# Patient Record
Sex: Female | Born: 1965 | Race: White | Hispanic: No | Marital: Single | Smoking: Current every day smoker
Health system: Southern US, Community
[De-identification: ages and names within clinical notes are randomized; demographics above are authoritative.]

## PROBLEM LIST (undated history)

## (undated) DIAGNOSIS — E7800 Pure hypercholesterolemia, unspecified: Secondary | ICD-10-CM

## (undated) DIAGNOSIS — J189 Pneumonia, unspecified organism: Secondary | ICD-10-CM

## (undated) DIAGNOSIS — I10 Essential (primary) hypertension: Secondary | ICD-10-CM

## (undated) DIAGNOSIS — R7303 Prediabetes: Secondary | ICD-10-CM

## (undated) DIAGNOSIS — K219 Gastro-esophageal reflux disease without esophagitis: Secondary | ICD-10-CM

## (undated) HISTORY — PX: TUBAL LIGATION: SHX77

## (undated) HISTORY — PX: CHOLECYSTECTOMY: SHX55

---

## 1997-10-14 ENCOUNTER — Encounter: Admission: RE | Admit: 1997-10-14 | Discharge: 1997-10-14 | Payer: Self-pay | Admitting: Family Medicine

## 1997-10-27 ENCOUNTER — Encounter: Admission: RE | Admit: 1997-10-27 | Discharge: 1997-10-27 | Payer: Self-pay | Admitting: Family Medicine

## 1999-09-15 ENCOUNTER — Encounter: Admission: RE | Admit: 1999-09-15 | Discharge: 1999-09-15 | Payer: Self-pay | Admitting: Family Medicine

## 2001-03-14 ENCOUNTER — Encounter: Admission: RE | Admit: 2001-03-14 | Discharge: 2001-03-14 | Payer: Self-pay | Admitting: Family Medicine

## 2005-01-10 ENCOUNTER — Ambulatory Visit: Payer: Self-pay | Admitting: Family Medicine

## 2005-05-30 ENCOUNTER — Ambulatory Visit: Payer: Self-pay | Admitting: Family Medicine

## 2006-05-04 DIAGNOSIS — R12 Heartburn: Secondary | ICD-10-CM

## 2018-12-20 ENCOUNTER — Other Ambulatory Visit: Payer: Self-pay

## 2018-12-20 DIAGNOSIS — Z20822 Contact with and (suspected) exposure to covid-19: Secondary | ICD-10-CM

## 2018-12-21 LAB — NOVEL CORONAVIRUS, NAA: SARS-CoV-2, NAA: NOT DETECTED

## 2018-12-24 ENCOUNTER — Telehealth: Payer: Self-pay | Admitting: General Practice

## 2018-12-24 NOTE — Telephone Encounter (Signed)
Gave patient negative covid results. Patient understood.  °

## 2019-04-21 ENCOUNTER — Ambulatory Visit
Admission: EM | Admit: 2019-04-21 | Discharge: 2019-04-21 | Disposition: A | Discharge status: Home / Self Care | Payer: Self-pay | Attending: Emergency Medicine | Admitting: Emergency Medicine

## 2019-04-21 ENCOUNTER — Other Ambulatory Visit: Payer: Self-pay

## 2019-04-21 DIAGNOSIS — J209 Acute bronchitis, unspecified: Secondary | ICD-10-CM

## 2019-04-21 DIAGNOSIS — Z20822 Contact with and (suspected) exposure to covid-19: Secondary | ICD-10-CM

## 2019-04-21 HISTORY — DX: Gastro-esophageal reflux disease without esophagitis: K21.9

## 2019-04-21 MED ORDER — PREDNISONE 10 MG (21) PO TBPK: ORAL_TABLET | ORAL | 0 refills | Status: DC

## 2019-04-21 MED ORDER — FLUTICASONE PROPIONATE 50 MCG/ACT NA SUSP: 1.0000 | Freq: Every day | NASAL | 0 refills | Status: DC

## 2019-04-21 MED ORDER — BENZONATATE 100 MG PO CAPS: 100.0000 mg | ORAL_CAPSULE | Freq: Three times a day (TID) | ORAL | 0 refills | Status: DC

## 2019-04-21 MED ORDER — AZITHROMYCIN 250 MG PO TABS: 250.0000 mg | ORAL_TABLET | Freq: Every day | ORAL | 0 refills | Status: DC

## 2019-04-21 NOTE — ED Triage Notes (Signed)
Pt presents to UC w/ c/o chest and nasal congestion, productive cough, lowgrade fever (highest 100.3) x3 days
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
## 2019-04-21 NOTE — Discharge Instructions (Addendum)
COVID testing ordered.  It will take between 5-7 days for test results.  Someone will contact you regarding abnormal results.    In the meantime: You should remain isolated in your home for 10 days from symptom onset AND greater than 72 hours after symptoms resolution (absence of fever without the use of fever-reducing medication and improvement in respiratory symptoms), whichever is longer Get plenty of rest and push fluids Azithromycin was prescribed Tessalon Perles prescribed for cough Prednisone prescribed Flonase prescribed for nasal congestion and runny nose Use medications daily for symptom relief Use OTC medications like ibuprofen or tylenol as needed fever or pain Call or go to the ED if you have any new or worsening symptoms such as fever, worsening cough, shortness of breath, chest tightness, chest pain, turning blue, changes in mental status, etc..Marland Kitchen

## 2019-04-22 LAB — NOVEL CORONAVIRUS, NAA: SARS-CoV-2, NAA: NOT DETECTED

## 2020-10-21 ENCOUNTER — Other Ambulatory Visit: Payer: Self-pay

## 2020-10-21 ENCOUNTER — Emergency Department (HOSPITAL_COMMUNITY)
Admission: EM | Admit: 2020-10-21 | Discharge: 2020-10-21 | Disposition: A | Discharge status: Home / Self Care | Payer: Self-pay | Attending: Emergency Medicine | Admitting: Emergency Medicine

## 2020-10-21 ENCOUNTER — Encounter (HOSPITAL_COMMUNITY): Payer: Self-pay

## 2020-10-21 DIAGNOSIS — M5432 Sciatica, left side: Secondary | ICD-10-CM

## 2020-10-21 DIAGNOSIS — R202 Paresthesia of skin: Secondary | ICD-10-CM | POA: Insufficient documentation

## 2020-10-21 DIAGNOSIS — F17210 Nicotine dependence, cigarettes, uncomplicated: Secondary | ICD-10-CM | POA: Insufficient documentation

## 2020-10-21 DIAGNOSIS — M545 Low back pain, unspecified: Secondary | ICD-10-CM | POA: Insufficient documentation

## 2020-10-21 MED ORDER — PREDNISONE 10 MG PO TABS: 40.0000 mg | ORAL_TABLET | Freq: Every day | ORAL | 0 refills | Status: DC

## 2020-10-21 MED ORDER — DEXAMETHASONE SODIUM PHOSPHATE 10 MG/ML IJ SOLN
12.0000 mg | Freq: Once | INTRAMUSCULAR | Status: AC
  Administered 2020-10-21: 12 mg via INTRAMUSCULAR
  Filled 2020-10-21: qty 2
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
## 2020-10-21 NOTE — ED Triage Notes (Signed)
Pt has back pain for the last couple of months, reports pain from back into leg and buttock, left side.  Pt alert and oriented, skin warm and dry

## 2020-10-21 NOTE — Discharge Instructions (Addendum)
Starting tomorrow, take 20 mg of prednisone in the morning and 20 mg of prednisone at night for 5 days.  Should help alleviate the pain, sciatica.  I have also given you information for a primary care provider, I would recommend establishing care with somebody and to recheck your blood pressure.  I have also given you information to neurosurgery, if symptoms do not improve or if they worsen you can follow-up with them for additional work-up.  If you develop a fever, urinary retention, unilateral weakness please return back to the ED for additional evaluation.

## 2020-11-05 ENCOUNTER — Emergency Department (HOSPITAL_COMMUNITY)
Admission: EM | Admit: 2020-11-05 | Discharge: 2020-11-05 | Disposition: A | Discharge status: Home / Self Care | Payer: Self-pay | Attending: Emergency Medicine | Admitting: Emergency Medicine

## 2020-11-05 ENCOUNTER — Encounter (HOSPITAL_COMMUNITY): Payer: Self-pay

## 2020-11-05 ENCOUNTER — Other Ambulatory Visit: Payer: Self-pay

## 2020-11-05 ENCOUNTER — Emergency Department (HOSPITAL_COMMUNITY): Payer: Self-pay

## 2020-11-05 DIAGNOSIS — F17210 Nicotine dependence, cigarettes, uncomplicated: Secondary | ICD-10-CM | POA: Insufficient documentation

## 2020-11-05 DIAGNOSIS — K5900 Constipation, unspecified: Secondary | ICD-10-CM | POA: Insufficient documentation

## 2020-11-05 DIAGNOSIS — R202 Paresthesia of skin: Secondary | ICD-10-CM | POA: Insufficient documentation

## 2020-11-05 DIAGNOSIS — M5442 Lumbago with sciatica, left side: Secondary | ICD-10-CM | POA: Insufficient documentation

## 2020-11-05 MED ORDER — HYDROCODONE-ACETAMINOPHEN 5-325 MG PO TABS: ORAL_TABLET | ORAL | 0 refills | Status: DC

## 2020-11-05 MED ORDER — KETOROLAC TROMETHAMINE 30 MG/ML IJ SOLN
30.0000 mg | Freq: Once | INTRAMUSCULAR | Status: AC
  Administered 2020-11-05: 30 mg via INTRAVENOUS
  Filled 2020-11-05: qty 1

## 2020-11-05 MED ORDER — ONDANSETRON HCL 4 MG PO TABS: 4.0000 mg | ORAL_TABLET | Freq: Four times a day (QID) | ORAL | 0 refills | Status: DC

## 2020-11-05 NOTE — Discharge Instructions (Addendum)
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 I think this needs to be managed by the neurosurgery/spine team, which I have included contact information for.  I am writing you a short course of pain medicine to tide you over until you can be seen by the spine specialists, as well as a prescription for Zofran, which is a nausea medicine.
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
## 2020-11-05 NOTE — ED Triage Notes (Signed)
Pt presents to ED with complaints of continued lower back pain down left leg. Pt states pain continues to get worse without relief.

## 2020-12-02 ENCOUNTER — Ambulatory Visit: Payer: Self-pay | Admitting: Family Medicine

## 2020-12-02 ENCOUNTER — Other Ambulatory Visit: Payer: Self-pay

## 2020-12-03 ENCOUNTER — Ambulatory Visit: Payer: Self-pay | Admitting: Family Medicine

## 2020-12-03 NOTE — Congregational Nurse Program (Signed)
Client into [facility] today to enroll in [facility]. Client with recent back pain and 2 recent ER Visits and MRI. States she has had a neurosurgeon visit  ( Dr Conchita Paris at [facility]) and will need surgery possibly.  Client wishes to establish primary Medical care with [facility]. Appointment secured for 12/17/20 at 1030 am  Client referred back to Alvis Lemmings CHW to begin [place] financial assistance process. She has had 2 ER visits recently.  Plan: Follow up with client after first visit.  Francee Nodal RN [employer]

## 2020-12-17 ENCOUNTER — Ambulatory Visit: Payer: Self-pay | Admitting: Physician Assistant

## 2020-12-17 ENCOUNTER — Encounter: Payer: Self-pay | Admitting: Physician Assistant

## 2020-12-17 VITALS — BP 164/87 | HR 106 | Temp 98.7°F | Ht 68.0 in | Wt 211.5 lb

## 2020-12-17 DIAGNOSIS — I10 Essential (primary) hypertension: Secondary | ICD-10-CM

## 2020-12-17 DIAGNOSIS — Z13220 Encounter for screening for lipoid disorders: Secondary | ICD-10-CM

## 2020-12-17 DIAGNOSIS — K219 Gastro-esophageal reflux disease without esophagitis: Secondary | ICD-10-CM

## 2020-12-17 DIAGNOSIS — Z1211 Encounter for screening for malignant neoplasm of colon: Secondary | ICD-10-CM

## 2020-12-17 DIAGNOSIS — Z7689 Persons encountering health services in other specified circumstances: Secondary | ICD-10-CM

## 2020-12-17 DIAGNOSIS — M48061 Spinal stenosis, lumbar region without neurogenic claudication: Secondary | ICD-10-CM

## 2020-12-17 DIAGNOSIS — Z131 Encounter for screening for diabetes mellitus: Secondary | ICD-10-CM

## 2020-12-17 DIAGNOSIS — Z1239 Encounter for other screening for malignant neoplasm of breast: Secondary | ICD-10-CM

## 2020-12-17 DIAGNOSIS — F17200 Nicotine dependence, unspecified, uncomplicated: Secondary | ICD-10-CM

## 2020-12-17 DIAGNOSIS — M5416 Radiculopathy, lumbar region: Secondary | ICD-10-CM

## 2020-12-17 MED ORDER — AMLODIPINE BESYLATE 5 MG PO TABS: 5.0000 mg | ORAL_TABLET | Freq: Every day | ORAL | 1 refills | Status: DC

## 2020-12-17 NOTE — Progress Notes (Signed)
BP (!) 164/87   Pulse (!) 106   Temp 98.7 F (37.1 C)   Ht 5\' 8"  (1.727 m)   Wt 211 lb 8 oz (95.9 kg)   SpO2 99%   BMI 32.16 kg/m    Subjective:    Patient ID: , female    DOB: Sep 04, 1965, 55 y.o.   MRN: [id number]  HPI: Susan Velasquez is a 55 y.o. female presenting on 12/17/2020 for New Patient (Initial Visit)   HPI   Pt had a negative covid 19 screening questionnaire.  Chief Complaint  Patient presents with   New Patient (Initial Visit)   Pt is 55yoF who says she has not had PCP in many years and she is ready to start taking care of her health.  [facility] but that was long ago.  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  She takes tums when needed.  She Tried nexium years ago but nothing recently.   In ER: BP 10/21/20- 166/116 Bp 11/05/20- 157/86 04/21/19- 145/92  She has not gotten covid vacination or flu shot.  She went to a back doctor- 04/23/19 back and neurosurgeon-   september or October.  He recommended surgery.  She did not get the surgery since he doesn't have insurance and the NS she saw honors no charity programs.    She is having a lot of pain that interferes with her sleep and has her unable to do her job.  She wants to get the back fixed so she can return to work.   Her pain goes down into the RLE.    MRI-   L5-S1 disc protrusion with severe left lateral recess stenosis and left S1 nerve root impingement. 2. Multiple small disc protrusions in the thoracic spine with mild cord flattening but no significant stenosis.       Relevant past medical, surgical, family and social history reviewed and updated as indicated. Interim medical history since our last visit reviewed. Allergies and medications reviewed and updated.   Current Outpatient Medications:    famotidine (PEPCID) 20 MG tablet, Take 20 mg by mouth 2 (two) times daily., Disp: , Rfl:     Review of Systems  Per  HPI unless specifically indicated above     Objective:    BP (!) 164/87   Pulse (!) 106   Temp 98.7 F (37.1 C)   Ht 5\' 8"  (1.727 m)   Wt 211 lb 8 oz (95.9 kg)   SpO2 99%   BMI 32.16 kg/m   Wt Readings from Last 3 Encounters:  12/17/20 211 lb 8 oz (95.9 kg)  11/05/20 200 lb (90.7 kg)  10/21/20 200 lb (90.7 kg)    Physical Exam Vitals reviewed.  Constitutional:      General: She is not in acute distress.    Appearance: She is well-developed. She is not toxic-appearing.  HENT:     Head: Normocephalic and atraumatic.     Right Ear: Tympanic membrane, ear canal and external ear normal.     Left Ear: Tympanic membrane, ear canal and external ear normal.  Eyes:     Extraocular Movements: Extraocular movements intact.     Conjunctiva/sclera: Conjunctivae normal.     Pupils: Pupils are equal, round, and reactive to light.  Neck:     Thyroid: No thyromegaly.  Cardiovascular:     Rate and Rhythm: Normal rate and regular rhythm.  Pulmonary:  Effort: Pulmonary effort is normal.     Breath sounds: Normal breath sounds.  Abdominal:     General: Bowel sounds are normal.     Palpations: Abdomen is soft. There is no mass.     Tenderness: There is no abdominal tenderness.  Musculoskeletal:     Cervical back: Neck supple.     Lumbar back: Tenderness present.     Right lower leg: No edema.     Left lower leg: No edema.  Lymphadenopathy:     Cervical: No cervical adenopathy.  Skin:    General: Skin is warm and dry.  Neurological:     Mental Status: She is alert and oriented to person, place, and time.     Motor: No weakness or tremor.     Gait: Gait abnormal.  Psychiatric:        Attention and Perception: Attention normal.        Speech: Speech normal.        Behavior: Behavior normal. Behavior is cooperative.     Comments: Pleasant and engaged          Assessment & Plan:   Encounter Diagnoses  Name Primary?   Encounter to establish care Yes   Primary  hypertension    Gastroesophageal reflux disease, unspecified whether esophagitis present    Screening for colon cancer    Screening for cholesterol level    Encounter for screening for malignant neoplasm of breast, unspecified screening modality    Screening for diabetes mellitus    Tobacco use disorder    Spinal stenosis of lumbar region, unspecified whether neurogenic claudication present    Lumbar nerve root impingement       -educated and encouraged Covid vaccination.  Appointment was made for getting the shot.  She will think about flu shot -will refer for screening Mammogram -will get Baseline labs -will refer to neurosurgery at [facility] -pt is to contact Care connect to apply for [facility] financial assistance -will start Amlodipine for bp -will Plan to update PAP in the future after her back issues get attention -pt is given FIT   test for colon cancer screening -encouraged smoking cessation.  Discussed with pt that neurosurgery might not be possible while she is smoking.  Gave her handouts with ways to help quit  -pt to follow up  4 wk.  She is to contact office sooner prn

## 2020-12-17 NOTE — Patient Instructions (Signed)
Call [facility] to get [facility] financial assistance

## 2020-12-18 ENCOUNTER — Other Ambulatory Visit (HOSPITAL_COMMUNITY)
Admission: RE | Admit: 2020-12-18 | Discharge: 2020-12-18 | Disposition: A | Discharge status: Home / Self Care | Payer: Self-pay | Source: Ambulatory Visit | Attending: Physician Assistant | Admitting: Physician Assistant

## 2020-12-18 ENCOUNTER — Other Ambulatory Visit: Payer: Self-pay

## 2020-12-18 DIAGNOSIS — I10 Essential (primary) hypertension: Secondary | ICD-10-CM | POA: Insufficient documentation

## 2020-12-18 DIAGNOSIS — Z13220 Encounter for screening for lipoid disorders: Secondary | ICD-10-CM | POA: Insufficient documentation

## 2020-12-18 DIAGNOSIS — Z131 Encounter for screening for diabetes mellitus: Secondary | ICD-10-CM | POA: Insufficient documentation

## 2020-12-18 LAB — HEMOGLOBIN A1C
Hgb A1c MFr Bld: 5.7 % — ABNORMAL HIGH (ref 4.8–5.6)
Mean Plasma Glucose: 116.89 mg/dL

## 2020-12-18 LAB — COMPREHENSIVE METABOLIC PANEL
ALT: 20 U/L (ref 0–44)
AST: 13 U/L — ABNORMAL LOW (ref 15–41)
Albumin: 4.2 g/dL (ref 3.5–5.0)
Alkaline Phosphatase: 84 U/L (ref 38–126)
Anion gap: 8 (ref 5–15)
BUN: 16 mg/dL (ref 6–20)
CO2: 28 mmol/L (ref 22–32)
Calcium: 9.6 mg/dL (ref 8.9–10.3)
Chloride: 102 mmol/L (ref 98–111)
Creatinine, Ser: 1.15 mg/dL — ABNORMAL HIGH (ref 0.44–1.00)
GFR, Estimated: 57 mL/min — ABNORMAL LOW (ref >60)
Glucose, Bld: 108 mg/dL — ABNORMAL HIGH (ref 70–99)
Potassium: 4.6 mmol/L (ref 3.5–5.1)
Sodium: 138 mmol/L (ref 135–145)
Total Bilirubin: 0.6 mg/dL (ref 0.3–1.2)
Total Protein: 7.7 g/dL (ref 6.5–8.1)

## 2020-12-18 LAB — LIPID PANEL
Cholesterol: 293 mg/dL — ABNORMAL HIGH (ref 0–200)
HDL: 42 mg/dL (ref >40)
LDL Cholesterol: 212 mg/dL — ABNORMAL HIGH (ref 0–99)
Total CHOL/HDL Ratio: 7 RATIO
Triglycerides: 197 mg/dL — ABNORMAL HIGH (ref <150)
VLDL: 39 mg/dL (ref 0–40)

## 2020-12-19 ENCOUNTER — Other Ambulatory Visit: Payer: Self-pay | Admitting: Physician Assistant

## 2020-12-19 DIAGNOSIS — Z1211 Encounter for screening for malignant neoplasm of colon: Secondary | ICD-10-CM

## 2020-12-28 ENCOUNTER — Other Ambulatory Visit: Payer: Self-pay

## 2020-12-28 DIAGNOSIS — Z1231 Encounter for screening mammogram for malignant neoplasm of breast: Secondary | ICD-10-CM

## 2020-12-29 ENCOUNTER — Telehealth: Payer: Self-pay

## 2020-12-29 NOTE — Telephone Encounter (Signed)
Called pt, left vm to call back. 

## 2021-01-04 NOTE — Telephone Encounter (Signed)
Spoke with pt who called back & informed of upcoming mammogram, pt stated she has info on mychart.

## 2021-01-04 NOTE — Telephone Encounter (Signed)
Called pt to inform of mammogram appt, left vm to call back.

## 2021-01-11 ENCOUNTER — Ambulatory Visit: Payer: Self-pay | Admitting: Physician Assistant

## 2021-01-11 ENCOUNTER — Other Ambulatory Visit: Payer: Self-pay

## 2021-01-11 ENCOUNTER — Ambulatory Visit (HOSPITAL_COMMUNITY)
Admission: RE | Admit: 2021-01-11 | Discharge: 2021-01-11 | Disposition: A | Discharge status: Home / Self Care | Payer: Self-pay | Source: Ambulatory Visit | Attending: Physician Assistant | Admitting: Physician Assistant

## 2021-01-11 ENCOUNTER — Encounter: Payer: Self-pay | Admitting: Physician Assistant

## 2021-01-11 VITALS — BP 153/84 | HR 107 | Temp 97.9°F | Wt 212.5 lb

## 2021-01-11 DIAGNOSIS — R7989 Other specified abnormal findings of blood chemistry: Secondary | ICD-10-CM

## 2021-01-11 DIAGNOSIS — M48061 Spinal stenosis, lumbar region without neurogenic claudication: Secondary | ICD-10-CM

## 2021-01-11 DIAGNOSIS — F17200 Nicotine dependence, unspecified, uncomplicated: Secondary | ICD-10-CM

## 2021-01-11 DIAGNOSIS — E785 Hyperlipidemia, unspecified: Secondary | ICD-10-CM | POA: Insufficient documentation

## 2021-01-11 DIAGNOSIS — K219 Gastro-esophageal reflux disease without esophagitis: Secondary | ICD-10-CM

## 2021-01-11 DIAGNOSIS — I10 Essential (primary) hypertension: Secondary | ICD-10-CM | POA: Insufficient documentation

## 2021-01-11 DIAGNOSIS — Z1231 Encounter for screening mammogram for malignant neoplasm of breast: Secondary | ICD-10-CM | POA: Insufficient documentation

## 2021-01-11 DIAGNOSIS — R7303 Prediabetes: Secondary | ICD-10-CM

## 2021-01-11 MED ORDER — AMLODIPINE BESYLATE 10 MG PO TABS
10.0000 mg | ORAL_TABLET | Freq: Every day | ORAL | 1 refills | Status: DC
Start: 2021-01-11 — End: 2021-04-12

## 2021-01-11 MED ORDER — OMEPRAZOLE 40 MG PO CPDR: 40.0000 mg | DELAYED_RELEASE_CAPSULE | Freq: Every day | ORAL | 1 refills | Status: DC

## 2021-01-11 MED ORDER — ATORVASTATIN CALCIUM 20 MG PO TABS: 20.0000 mg | ORAL_TABLET | Freq: Every day | ORAL | 1 refills | Status: DC

## 2021-01-11 NOTE — Patient Instructions (Addendum)
High Cholesterol High cholesterol is a condition in which the blood has high levels of a white, waxy substance similar to fat (cholesterol). The liver makes all the cholesterol that the body needs. The human body needs small amounts of cholesterol to help build cells. A person gets extra or excess cholesterol from the food that he or she eats. The blood carries cholesterol from the liver to the rest of the body. If you have high cholesterol, deposits (plaques) may build up on the walls of your arteries. Arteries are the blood vessels that carry blood away from your heart. These plaques make the arteries narrow and stiff. Cholesterol plaques increase your risk for heart attack and stroke. Work with your health care provider to keep your cholesterol levels in a healthy range. What increases the risk? The following factors may make you more likely to develop this condition: Eating foods that are high in animal fat (saturated fat) or cholesterol. Being overweight. Not getting enough exercise. A family history of high cholesterol (familial hypercholesterolemia). Use of tobacco products. Having diabetes. What are the signs or symptoms? In most cases, high cholesterol does not usually cause any symptoms. In severe cases, very high cholesterol levels can cause: Fatty bumps under the skin (xanthomas). A white or gray ring around the black center (pupil) of the eye. How is this diagnosed? This condition may be diagnosed based on the results of a blood test. If you are older than 53 years of age, your health care provider may check your cholesterol levels every 4-6 years. You may be checked more often if you have high cholesterol or other risk factors for heart disease. The blood test for cholesterol measures: "Bad" cholesterol, or LDL cholesterol. This is the main type of cholesterol that causes heart disease. The desired level is less than 100 mg/dL (2.59 mmol/L). "Good" cholesterol, or HDL  cholesterol. HDL helps protect against heart disease by cleaning the arteries and carrying the LDL to the liver for processing. The desired level for HDL is 60 mg/dL (1.55 mmol/L) or higher. Triglycerides. These are fats that your body can store or burn for energy. The desired level is less than 150 mg/dL (1.69 mmol/L). Total cholesterol. This measures the total amount of cholesterol in your blood and includes LDL, HDL, and triglycerides. The desired level is less than 200 mg/dL (5.17 mmol/L). How is this treated? Treatment for high cholesterol starts with lifestyle changes, such as diet and exercise. Diet changes. You may be asked to eat foods that have more fiber and less saturated fats or added sugar. Lifestyle changes. These may include regular exercise, maintaining a healthy weight, and quitting use of tobacco products. Medicines. These are given when diet and lifestyle changes have not worked. You may be prescribed a statin medicine to help lower your cholesterol levels. Follow these instructions at home: Eating and drinking  Eat a healthy, balanced diet. This diet includes: Daily servings of a variety of fresh, frozen, or canned fruits and vegetables. Daily servings of whole grain foods that are rich in fiber. Foods that are low in saturated fats and trans fats. These include poultry and fish without skin, lean cuts of meat, and low-fat dairy products. A variety of fish, especially oily fish that contain omega-3 fatty acids. Aim to eat fish at least 2 times a week. Avoid foods and drinks that have added sugar. Use healthy cooking methods, such as roasting, grilling, broiling, baking, poaching, steaming, and stir-frying. Do not fry your food except for stir-frying.  stir-frying. °If you drink alcohol: °Limit how much you have to: °0-1 drink a day for women who are not pregnant. °0-2 drinks a day for men. °Know how much alcohol is in a drink. In the [place] one drink equals one 12 oz bottle of beer (355 mL),  one 5 oz glass of wine (148 mL), or one 1½ oz glass of hard liquor (44 mL). °Lifestyle ° °Get regular exercise. Aim to exercise for a total of 150 minutes a week. Increase your activity level by doing activities such as gardening, walking, and taking the stairs. °Do not use any products that contain nicotine or tobacco. These products include cigarettes, chewing tobacco, and vaping devices, such as e-cigarettes. If you need help quitting, ask your health care provider. °General instructions °Take over-the-counter and prescription medicines only as told by your health care provider. °Keep all follow-up visits. This is important. °Where to find more information °American Heart Association: www.heart.org °National Heart, Lung, and Blood Institute: www.nhlbi.nih.gov °Contact a health care provider if: °You have trouble achieving or maintaining a healthy diet or weight. °You are starting an exercise program. °You are unable to stop smoking. °Get help right away if: °You have chest pain. °You have trouble breathing. °You have discomfort or pain in your jaw, neck, back, shoulder, or arm. °You have any symptoms of a stroke. "BE FAST" is an easy way to remember the main warning signs of a stroke: °B - Balance. Signs are dizziness, sudden trouble walking, or loss of balance. °E - Eyes. Signs are trouble seeing or a sudden change in vision. °F - Face. Signs are sudden weakness or numbness of the face, or the face or eyelid drooping on one side. °A - Arms. Signs are weakness or numbness in an arm. This happens suddenly and usually on one side of the body. °S - Speech. Signs are sudden trouble speaking, slurred speech, or trouble understanding what people say. °T - Time. Time to call emergency services. Write down what time symptoms started. °You have other signs of a stroke, such as: °A sudden, severe headache with no known cause. °Nausea or vomiting. °Seizure. °These symptoms may represent a serious problem that is an  emergency. Do not wait to see if the symptoms will go away. Get medical help right away. Call your local emergency services (911 in the [place] Do not drive yourself to the hospital. °Summary °Cholesterol plaques increase your risk for heart attack and stroke. Work with your health care provider to keep your cholesterol levels in a healthy range. °Eat a healthy, balanced diet, get regular exercise, and maintain a healthy weight. °Do not use any products that contain nicotine or tobacco. These products include cigarettes, chewing tobacco, and vaping devices, such as e-cigarettes. °Get help right away if you have any symptoms of a stroke. °This information is not intended to replace advice given to you by your health care provider. Make sure you discuss any questions you have with your health care provider. °Document Revised: 05/07/2020 Document Reviewed: 04/27/2020 °Elsevier Patient Education © 2022 [employer] ° ° °------------------------------------------------------------------ ° °Gastroesophageal Reflux Disease, Adult °Gastroesophageal reflux (GER) happens when acid from the stomach flows up into the tube that connects the mouth and the stomach (esophagus). Normally, food travels down the esophagus and stays in the stomach to be digested. However, when a person has GER, food and stomach acid sometimes move back up into the esophagus. If this becomes a more serious problem, the person may be diagnosed with a   disease called gastroesophageal reflux disease (GERD). GERD occurs when the reflux: Happens often. Causes frequent or severe symptoms. Causes problems such as damage to the esophagus. When stomach acid comes in contact with the esophagus, the acid may cause inflammation in the esophagus. Over time, GERD may create small holes (ulcers) in the lining of the esophagus. What are the causes? This condition is caused by a problem with the muscle between the esophagus and the stomach (lower  esophageal sphincter, or LES). Normally, the LES muscle closes after food passes through the esophagus to the stomach. When the LES is weakened or abnormal, it does not close properly, and that allows food and stomach acid to go back up into the esophagus. The LES can be weakened by certain dietary substances, medicines, and medical conditions, including: Tobacco use. Pregnancy. Having a hiatal hernia. Alcohol use. Certain foods and beverages, such as coffee, chocolate, onions, and peppermint. What increases the risk? You are more likely to develop this condition if you: Have an increased body weight. Have a connective tissue disorder. Take NSAIDs, such as ibuprofen. What are the signs or symptoms? Symptoms of this condition include: Heartburn. Difficult or painful swallowing and the feeling of having a lump in the throat. A bitter taste in the mouth. Bad breath and having a large amount of saliva. Having an upset or bloated stomach and belching. Chest pain. Different conditions can cause chest pain. Make sure you see your health care provider if you experience chest pain. Shortness of breath or wheezing. Ongoing (chronic) cough or a nighttime cough. Wearing away of tooth enamel. Weight loss. How is this diagnosed? This condition may be diagnosed based on a medical history and a physical exam. To determine if you have mild or severe GERD, your health care provider may also monitor how you respond to treatment. You may also have tests, including: A test to examine your stomach and esophagus with a small camera (endoscopy). A test that measures the acidity level in your esophagus. A test that measures how much pressure is on your esophagus. A barium swallow or modified barium swallow test to show the shape, size, and functioning of your esophagus. How is this treated? Treatment for this condition may vary depending on how severe your symptoms are. Your health care provider may  recommend: Changes to your diet. Medicine. Surgery. The goal of treatment is to help relieve your symptoms and to prevent complications. Follow these instructions at home: Eating and drinking  Follow a diet as recommended by your health care provider. This may involve avoiding foods and drinks such as: Coffee and tea, with or without caffeine. Drinks that contain alcohol. Energy drinks and sports drinks. Carbonated drinks or sodas. Chocolate and cocoa. Peppermint and mint flavorings. Garlic and onions. Horseradish. Spicy and acidic foods, including peppers, chili powder, curry powder, vinegar, hot sauces, and barbecue sauce. Citrus fruit juices and citrus fruits, such as oranges, lemons, and limes. Tomato-based foods, such as red sauce, chili, salsa, and pizza with red sauce. Fried and fatty foods, such as donuts, french fries, potato chips, and high-fat dressings. High-fat meats, such as hot dogs and fatty cuts of red and white meats, such as rib eye steak, sausage, ham, and bacon. High-fat dairy items, such as whole milk, butter, and cream cheese. Eat small, frequent meals instead of large meals. Avoid drinking large amounts of liquid with your meals. Avoid eating meals during the 2-3 hours before bedtime. Avoid lying down right after you eat. Do not exercise  right after you eat. °Lifestyle ° °Do not use any products that contain nicotine or tobacco. These products include cigarettes, chewing tobacco, and vaping devices, such as e-cigarettes. If you need help quitting, ask your health care provider. °Try to reduce your stress by using methods such as yoga or meditation. If you need help reducing stress, ask your health care provider. °If you are overweight, reduce your weight to an amount that is healthy for you. Ask your health care provider for guidance about a safe weight loss goal. °General instructions °Pay attention to any changes in your symptoms. °Take over-the-counter and prescription medicines  only as told by your health care provider. Do not take aspirin, ibuprofen, or other NSAIDs unless your health care provider told you to take these medicines. °Wear loose-fitting clothing. Do not wear anything tight around your waist that causes pressure on your abdomen. °Raise (elevate) the head of your bed about 6 inches (15 cm). You can use a wedge to do this. °Avoid bending over if this makes your symptoms worse. °Keep all follow-up visits. This is important. °Contact a health care provider if: °You have: °New symptoms. °Unexplained weight loss. °Difficulty swallowing or it hurts to swallow. °Wheezing or a persistent cough. °A hoarse voice. °Your symptoms do not improve with treatment. °Get help right away if: °You have sudden pain in your arms, neck, jaw, teeth, or back. °You suddenly feel sweaty, dizzy, or light-headed. °You have chest pain or shortness of breath. °You vomit and the vomit is green, yellow, or black, or it looks like blood or coffee grounds. °You faint. °You have stool that is red, bloody, or black. °You cannot swallow, drink, or eat. °These symptoms may represent a serious problem that is an emergency. Do not wait to see if the symptoms will go away. Get medical help right away. Call your local emergency services (911 in the [place] Do not drive yourself to the hospital. °Summary °Gastroesophageal reflux happens when acid from the stomach flows up into the esophagus. GERD is a disease in which the reflux happens often, causes frequent or severe symptoms, or causes problems such as damage to the esophagus. °Treatment for this condition may vary depending on how severe your symptoms are. Your health care provider may recommend diet and lifestyle changes, medicine, or surgery. °Contact a health care provider if you have new or worsening symptoms. °Take over-the-counter and prescription medicines only as told by your health care provider. Do not take aspirin, ibuprofen, or other NSAIDs unless your  health care provider told you to do so. °Keep all follow-up visits as told by your health care provider. This is important. °This information is not intended to replace advice given to you by your health care provider. Make sure you discuss any questions you have with your health care provider. °Document Revised: 09/02/2019 Document Reviewed: 09/02/2019 °Elsevier Patient Education © 2022 Elsevier Inc. ° °

## 2021-01-11 NOTE — Progress Notes (Signed)
BP (!) 153/84   Pulse (!) 107   Temp 97.9 F (36.6 C)   Wt 212 lb 8 oz (96.4 kg)   SpO2 97%   BMI 32.31 kg/m    Subjective:    Patient ID: Susan Velasquez, female    DOB: Jan 25, 1966, 55 y.o.   MRN: [id number]  HPI: Susan Velasquez is a 55 y.o. female presenting on 01/11/2021 for Hypertension   HPI   Chief Complaint  Patient presents with   Hypertension     Pt is 55yoF who presents for recheck BP and to review labs.  Her new patient appointment was 12/17/20.  She still has FIT test at home for colon cancer screening.  She has screening Mammogram appointment this afternoon  She has Appointment with NeuroSurgery next Wednesday    Relevant past medical, surgical, family and social history reviewed and updated as indicated. Interim medical history since our last visit reviewed. Allergies and medications reviewed and updated.   Current Outpatient Medications:    amLODipine (NORVASC) 5 MG tablet, Take 1 tablet (5 mg total) by mouth daily., Disp: 30 tablet, Rfl: 1   famotidine (PEPCID) 20 MG tablet, Take 20 mg by mouth 2 (two) times daily., Disp: , Rfl:     Review of Systems  Per HPI unless specifically indicated above     Objective:    BP (!) 153/84   Pulse (!) 107   Temp 97.9 F (36.6 C)   Wt 212 lb 8 oz (96.4 kg)   SpO2 97%   BMI 32.31 kg/m   Wt Readings from Last 3 Encounters:  01/11/21 212 lb 8 oz (96.4 kg)  12/17/20 211 lb 8 oz (95.9 kg)  11/05/20 200 lb (90.7 kg)    Physical Exam Vitals reviewed.  Constitutional:      General: She is not in acute distress.    Appearance: She is well-developed. She is not toxic-appearing.  HENT:     Head: Normocephalic and atraumatic.  Cardiovascular:     Rate and Rhythm: Normal rate and regular rhythm.  Pulmonary:     Effort: Pulmonary effort is normal.     Breath sounds: Normal breath sounds.  Abdominal:     General: Bowel sounds are normal.     Palpations: Abdomen is soft. There is no mass.      Tenderness: There is no abdominal tenderness.  Musculoskeletal:     Cervical back: Neck supple.     Right lower leg: No edema.     Left lower leg: No edema.  Lymphadenopathy:     Cervical: No cervical adenopathy.  Skin:    General: Skin is warm and dry.  Neurological:     Mental Status: She is alert and oriented to person, place, and time.  Psychiatric:        Behavior: Behavior normal.    Results for orders placed or performed during the hospital encounter of 12/18/20  Hemoglobin A1c  Result Value Ref Range   Hgb A1c MFr Bld 5.7 (H) 4.8 - 5.6 %   Mean Plasma Glucose 116.89 mg/dL  Lipid panel  Result Value Ref Range   Cholesterol 293 (H) 0 - 200 mg/dL   Triglycerides 211 (H) <150 mg/dL   HDL 42 >94 mg/dL   Total CHOL/HDL Ratio 7.0 RATIO   VLDL 39 0 - 40 mg/dL   LDL Cholesterol 174 (H) 0 - 99 mg/dL  Comprehensive metabolic panel  Result Value Ref Range   Sodium 138 135 -  145 mmol/L   Potassium 4.6 3.5 - 5.1 mmol/L   Chloride 102 98 - 111 mmol/L   CO2 28 22 - 32 mmol/L   Glucose, Bld 108 (H) 70 - 99 mg/dL   BUN 16 6 - 20 mg/dL   Creatinine, Ser 6.62 (H) 0.44 - 1.00 mg/dL   Calcium 9.6 8.9 - 94.7 mg/dL   Total Protein 7.7 6.5 - 8.1 g/dL   Albumin 4.2 3.5 - 5.0 g/dL   AST 13 (L) 15 - 41 U/L   ALT 20 0 - 44 U/L   Alkaline Phosphatase 84 38 - 126 U/L   Total Bilirubin 0.6 0.3 - 1.2 mg/dL   GFR, Estimated 57 (L) >60 mL/min   Anion gap 8 5 - 15      Assessment & Plan:    Encounter Diagnoses  Name Primary?   Primary hypertension Yes   Hyperlipidemia, unspecified hyperlipidemia type    Gastroesophageal reflux disease, unspecified whether esophagitis present    Tobacco use disorder    Spinal stenosis of lumbar region, unspecified whether neurogenic claudication present    Elevated serum creatinine    Prediabetes      -reviewed labs with pt -pt to Increase fluids/water -Counseled lowfat diet.  Add statin/start atorvastatin.  She was given reading  information -Increase amlodipine -discussed prediabetes and strategies to less risk of progression to diabetes -rx omeprazole for gerd and counseled on lifestyle changes to help.  She was given reading information -mammogram and neurosurgery as scheduled -encouraged smoking cessation and covid vaccination -she is very motivated to make changes for the positive to improve her health -Check bp and cr in 4 wk.  She is to contact office sooner prn

## 2021-02-02 ENCOUNTER — Other Ambulatory Visit: Payer: Self-pay | Admitting: Neurosurgery

## 2021-02-05 NOTE — Progress Notes (Signed)
Surgical Instructions    Your procedure is scheduled on Friday, December 9th, 2022.   Report to [facility] Main Entrance "A" at 08:30 A.M., then check in with the Admitting office.  Call this number if you have problems the morning of surgery:  [phone]   If you have any questions prior to your surgery date call [phone]: Open Monday-Friday 8am-4pm    Remember:  Do not eat or drink after midnight the night before your surgery     Take these medicines the morning of surgery with A SIP OF WATER:  amLODipine (NORVASC)  atorvastatin (LIPITOR) omeprazole (PRILOSEC)  As of today, STOP taking any Aspirin (unless otherwise instructed by your surgeon) Aleve, Naproxen, Ibuprofen, Motrin, Advil, Goody's, BC's, all herbal medications, fish oil, and all vitamins.   After your COVID test   You are not required to quarantine however you are required to wear a well-fitting mask when you are out and around people not in your household.  If your mask becomes wet or soiled, replace with a new one.  Wash your hands often with soap and water for 20 seconds or clean your hands with an alcohol-based hand sanitizer that contains at least 60% alcohol.  Do not share personal items.  Notify your provider: if you are in close contact with someone who has COVID  or if you develop a fever of 100.4 or greater, sneezing, cough, sore throat, shortness of breath or body aches.    The day of surgery:          Do not wear jewelry or makeup Do not wear lotions, powders, perfumes, or deodorant. Do not shave 48 hours prior to surgery.   Do not bring valuables to the hospital. DO Not wear nail polish, gel polish, artificial nails, or any other type of covering on natural nails including finger and toenails. If patients have artificial nails, gel coating, etc. that need to be removed by a nail salon, please have this removed prior to surgery or surgery may need to be canceled/delayed if the surgeon/  anesthesia feels like the patient is unable to be adequately monitored.              [facility]an Velas[facility] is not responsible for any belongings or valuables.  Do NOT Smoke (Tobacco/Vaping)  24 hours prior to your procedure  If you use a CPAP at night, you may bring your mask for your overnight stay.   Contacts, glasses, hearing aids, dentures or partials may not be worn into surgery, please bring cases for these belongings   For patients admitted to the hospital, discharge time will be determined by your treatment team.   Patients discharged the day of surgery will not be allowed to drive home, and someone needs to stay with them for 24 hours.  NO VISITORS WILL BE ALLOWED IN PRE-OP WHERE PATIENTS ARE PREPPED FOR SURGERY.  ONLY 1 SUPPORT PERSON MAY BE PRESENT IN THE WAITING ROOM WHILE YOU ARE IN SURGERY.  IF YOU ARE TO BE ADMITTED, ONCE YOU ARE IN YOUR ROOM YOU WILL BE ALLOWED TWO (2) VISITORS. 1 (ONE) VISITOR MAY STAY OVERNIGHT BUT MUST ARRIVE TO THE ROOM BY 8pm.  Minor children may have two parents present. Special consideration for safety and communication needs will be reviewed on a case by case basis.  Special instructions:    Oral Hygiene is also important to reduce your risk of infection.  Remember - BRUSH YOUR TEETH THE MORNING OF SURGERY WITH YOUR REGULAR TOOTHPASTE  [place]- Preparing For Surgery  Before surgery, you can play an important role. Because skin is not sterile, your skin needs to be as free of germs as possible. You can reduce the number of germs on your skin by washing with CHG (chlorahexidine gluconate) Soap before surgery.  CHG is an antiseptic cleaner which kills germs and bonds with the skin to continue killing germs even after washing.     Please do not use if you have an allergy to CHG or antibacterial soaps. If your skin becomes reddened/irritated stop using the CHG.  Do not shave (including legs and underarms) for at least 48 hours prior to first CHG shower. It  is OK to shave your face.  Please follow these instructions carefully.     Shower the NIGHT BEFORE SURGERY and the MORNING OF SURGERY with CHG Soap.   If you chose to wash your hair, wash your hair first as usual with your normal shampoo. After you shampoo, rinse your hair and body thoroughly to remove the shampoo.  Then [employer] and genitals (private parts) with your normal soap and rinse thoroughly to remove soap.  After that Use CHG Soap as you would any other liquid soap. You can apply CHG directly to the skin and wash gently with a scrungie or a clean washcloth.   Apply the CHG Soap to your body ONLY FROM THE NECK DOWN.  Do not use on open wounds or open sores. Avoid contact with your eyes, ears, mouth and genitals (private parts). Wash Face and genitals (private parts)  with your normal soap.   Wash thoroughly, paying special attention to the area where your surgery will be performed.  Thoroughly rinse your body with warm water from the neck down.  DO NOT shower/wash with your normal soap after using and rinsing off the CHG Soap.  Pat yourself dry with a CLEAN TOWEL.  Wear CLEAN PAJAMAS to bed the night before surgery  Place CLEAN SHEETS on your bed the night before your surgery  DO NOT SLEEP WITH PETS.   Day of Surgery:  Take a shower with CHG soap. Wear Clean/Comfortable clothing the morning of surgery Do not apply any deodorants/lotions.   Remember to brush your teeth WITH YOUR REGULAR TOOTHPASTE.   Please read over the following fact sheets that you were given.

## 2021-02-08 ENCOUNTER — Other Ambulatory Visit: Payer: Self-pay

## 2021-02-08 ENCOUNTER — Ambulatory Visit: Payer: Self-pay | Admitting: Physician Assistant

## 2021-02-08 ENCOUNTER — Encounter (HOSPITAL_COMMUNITY)
Admission: RE | Admit: 2021-02-08 | Discharge: 2021-02-08 | Disposition: A | Discharge status: Home / Self Care | Payer: Self-pay | Source: Ambulatory Visit | Attending: Neurosurgery | Admitting: Neurosurgery

## 2021-02-08 ENCOUNTER — Encounter (HOSPITAL_COMMUNITY): Payer: Self-pay

## 2021-02-08 VITALS — BP 159/94 | HR 97 | Temp 98.5°F | Resp 18 | Ht 67.0 in | Wt 214.2 lb

## 2021-02-08 DIAGNOSIS — Z01818 Encounter for other preprocedural examination: Secondary | ICD-10-CM | POA: Insufficient documentation

## 2021-02-08 HISTORY — DX: Prediabetes: R73.03

## 2021-02-08 HISTORY — DX: Pneumonia, unspecified organism: J18.9

## 2021-02-08 HISTORY — DX: Essential (primary) hypertension: I10

## 2021-02-08 LAB — CBC
HCT: 46.8 % — ABNORMAL HIGH (ref 36.0–46.0)
Hemoglobin: 15.3 g/dL — ABNORMAL HIGH (ref 12.0–15.0)
MCH: 30.1 pg (ref 26.0–34.0)
MCHC: 32.7 g/dL (ref 30.0–36.0)
MCV: 91.9 fL (ref 80.0–100.0)
Platelets: 263 10*3/uL (ref 150–400)
RBC: 5.09 MIL/uL (ref 3.87–5.11)
RDW: 12.7 % (ref 11.5–15.5)
WBC: 11 10*3/uL — ABNORMAL HIGH (ref 4.0–10.5)
nRBC: 0 % (ref 0.0–0.2)

## 2021-02-08 LAB — BASIC METABOLIC PANEL
Anion gap: 7 (ref 5–15)
BUN: 9 mg/dL (ref 6–20)
CO2: 27 mmol/L (ref 22–32)
Calcium: 9.6 mg/dL (ref 8.9–10.3)
Chloride: 101 mmol/L (ref 98–111)
Creatinine, Ser: 1.01 mg/dL — ABNORMAL HIGH (ref 0.44–1.00)
GFR, Estimated: >60 mL/min (ref >60)
Glucose, Bld: 154 mg/dL — ABNORMAL HIGH (ref 70–99)
Potassium: 3.7 mmol/L (ref 3.5–5.1)
Sodium: 135 mmol/L (ref 135–145)

## 2021-02-08 LAB — GLUCOSE, CAPILLARY: Glucose-Capillary: 153 mg/dL — ABNORMAL HIGH (ref 70–99)

## 2021-02-08 LAB — SURGICAL PCR SCREEN
MRSA, PCR: NEGATIVE
Staphylococcus aureus: NEGATIVE

## 2021-02-08 NOTE — Progress Notes (Addendum)
PCP - Jacquelin Hawking, PA-C Cardiologist - denies  PPM/ICD - denies Device Orders - n/a Rep Notified - n/a  Chest x-ray - n/a EKG - 02/08/2021 Stress Test - denies ECHO - denies Cardiac Cath - denies  Sleep Study - denies CPAP - denies  CBG today - 153 (pre-diabetes)  Blood Thinner Instructions: n/a  Aspirin Instructions: Patient was instructed: As of today, STOP taking any Aspirin (unless otherwise instructed by your surgeon) Aleve, Naproxen, Ibuprofen, Motrin, Advil, Goody's, BC's, all herbal medications, fish oil, and all vitamins.  ERAS Protcol - n/a  COVID TEST- no - ambulatory surgery   Anesthesia review: no  In PAT patient's pulse 112 when she came; when rechecked, Pulse 104 and 97. Patient verbalized that she didn't take his BP medicine this morning. Patient was advised to take her BP medicine as indicated, and check BP and pulse regularly. If any distress, patient was instructed to contact her PCP. Patient verbalized understanding.  Patient denies shortness of breath, fever, cough and chest pain at PAT appointment   All instructions explained to the patient, with a verbal understanding of the material. Patient agrees to go over the instructions while at home for a better understanding. Patient also instructed to self quarantine after being tested for COVID-19. The opportunity to ask questions was provided.

## 2021-02-09 ENCOUNTER — Ambulatory Visit: Payer: Self-pay | Admitting: Physician Assistant

## 2021-02-09 ENCOUNTER — Encounter: Payer: Self-pay | Admitting: Physician Assistant

## 2021-02-09 VITALS — BP 132/76 | HR 91 | Temp 97.2°F | Wt 215.0 lb

## 2021-02-09 DIAGNOSIS — M48061 Spinal stenosis, lumbar region without neurogenic claudication: Secondary | ICD-10-CM

## 2021-02-09 DIAGNOSIS — F17200 Nicotine dependence, unspecified, uncomplicated: Secondary | ICD-10-CM

## 2021-02-09 DIAGNOSIS — E785 Hyperlipidemia, unspecified: Secondary | ICD-10-CM

## 2021-02-09 DIAGNOSIS — I10 Essential (primary) hypertension: Secondary | ICD-10-CM

## 2021-02-09 DIAGNOSIS — K219 Gastro-esophageal reflux disease without esophagitis: Secondary | ICD-10-CM

## 2021-02-09 LAB — IFOBT (OCCULT BLOOD): IFOBT: NEGATIVE

## 2021-02-09 NOTE — Progress Notes (Signed)
BP 132/76   Pulse 91   Temp (!) 97.2 F (36.2 C)   Wt 215 lb (97.5 kg)   SpO2 97%   BMI 33.67 kg/m    Subjective:    Patient ID: Susan Velasquez, female    DOB: 1965/10/05, 55 y.o.   MRN: [id number]  HPI: Susan Velasquez is a 55 y.o. female presenting on 02/09/2021 for Hypertension   HPI    Chief Complaint  Patient presents with   Hypertension     Pt says her GI symptoms much better.  She says the rx omeprazole is working very well.  She has Not gotten covid vaccinations or flu shot  She is scheduled for back surgery this Friday at [facility].  She says she is supposed to have outpatient procedure and go home the same day.  She says she will have follow up with neurosurgery in  January  She is still smoking a little bit but is working on cutting back.       Relevant past medical, surgical, family and social history reviewed and updated as indicated. Interim medical history since our last visit reviewed. Allergies and medications reviewed and updated.   Current Outpatient Medications:    amLODipine (NORVASC) 10 MG tablet, Take 1 tablet (10 mg total) by mouth daily., Disp: 90 tablet, Rfl: 1   atorvastatin (LIPITOR) 20 MG tablet, Take 1 tablet (20 mg total) by mouth daily., Disp: 90 tablet, Rfl: 1   omeprazole (PRILOSEC) 40 MG capsule, Take 1 capsule (40 mg total) by mouth daily., Disp: 90 capsule, Rfl: 1     Review of Systems  Per HPI unless specifically indicated above     Objective:    BP 132/76   Pulse 91   Temp (!) 97.2 F (36.2 C)   Wt 215 lb (97.5 kg)   SpO2 97%   BMI 33.67 kg/m   Wt Readings from Last 3 Encounters:  02/09/21 215 lb (97.5 kg)  02/08/21 214 lb 3.2 oz (97.2 kg)  01/11/21 212 lb 8 oz (96.4 kg)    Physical Exam Vitals reviewed.  Constitutional:      General: She is not in acute distress.    Appearance: She is well-developed. She is not ill-appearing.  HENT:     Head: Normocephalic and atraumatic.  Cardiovascular:     Rate and  Rhythm: Normal rate and regular rhythm.  Pulmonary:     Effort: Pulmonary effort is normal.     Breath sounds: Normal breath sounds.  Musculoskeletal:     Cervical back: Neck supple.     Right lower leg: No edema.     Left lower leg: No edema.  Lymphadenopathy:     Cervical: No cervical adenopathy.  Skin:    General: Skin is warm and dry.  Neurological:     Mental Status: She is alert and oriented to person, place, and time.  Psychiatric:        Behavior: Behavior normal.    Results for orders placed or performed during the hospital encounter of 02/08/21  Surgical pcr screen   Specimen: Nasal Mucosa; Nasal Swab  Result Value Ref Range   MRSA, PCR NEGATIVE NEGATIVE   Staphylococcus aureus NEGATIVE NEGATIVE  CBC per protocol  Result Value Ref Range   WBC 11.0 (H) 4.0 - 10.5 K/uL   RBC 5.09 3.87 - 5.11 MIL/uL   Hemoglobin 15.3 (H) 12.0 - 15.0 g/dL   HCT 27.0 (H) 62.3 - 76.2 %   MCV  91.9 80.0 - 100.0 fL   MCH 30.1 26.0 - 34.0 pg   MCHC 32.7 30.0 - 36.0 g/dL   RDW 02.6 37.8 - 58.8 %   Platelets 263 150 - 400 K/uL   nRBC 0.0 0.0 - 0.2 %  Basic metabolic panel per protocol  Result Value Ref Range   Sodium 135 135 - 145 mmol/L   Potassium 3.7 3.5 - 5.1 mmol/L   Chloride 101 98 - 111 mmol/L   CO2 27 22 - 32 mmol/L   Glucose, Bld 154 (H) 70 - 99 mg/dL   BUN 9 6 - 20 mg/dL   Creatinine, Ser 5.02 (H) 0.44 - 1.00 mg/dL   Calcium 9.6 8.9 - 77.4 mg/dL   GFR, Estimated >12 >87 mL/min   Anion gap 7 5 - 15  Glucose, capillary  Result Value Ref Range   Glucose-Capillary 153 (H) 70 - 99 mg/dL   Comment 1 Notify RN         Assessment & Plan:    Encounter Diagnoses  Name Primary?   Primary hypertension Yes   Hyperlipidemia, unspecified hyperlipidemia type    Gastroesophageal reflux disease, unspecified whether esophagitis present    Tobacco use disorder    Spinal stenosis of lumbar region, unspecified whether neurogenic claudication present      -reviewed pre-op labs  with pt as she was a bit concerned by the red exclamation points on mychart.  Her renal function is improved from previous labs -pt to continue current medications -she is encouraged to continue smoking cessation efforts -pt is scheduled to return for flu shot on 02/22/21.  She will return for bp and dyslipidemia in 2 months.  She is to contact office sooner prn

## 2021-02-12 ENCOUNTER — Ambulatory Visit (HOSPITAL_COMMUNITY): Payer: Self-pay

## 2021-02-12 ENCOUNTER — Encounter (HOSPITAL_COMMUNITY)
Admission: RE | Disposition: A | Discharge status: Home / Self Care | Payer: Self-pay | Source: Ambulatory Visit | Attending: Neurosurgery

## 2021-02-12 ENCOUNTER — Ambulatory Visit (HOSPITAL_COMMUNITY)
Admission: RE | Admit: 2021-02-12 | Discharge: 2021-02-12 | Disposition: A | Discharge status: Home / Self Care | Payer: Self-pay | Source: Ambulatory Visit | Attending: Neurosurgery | Admitting: Neurosurgery

## 2021-02-12 ENCOUNTER — Ambulatory Visit (HOSPITAL_COMMUNITY): Payer: Self-pay | Admitting: Critical Care Medicine

## 2021-02-12 ENCOUNTER — Encounter (HOSPITAL_COMMUNITY): Payer: Self-pay | Admitting: Neurosurgery

## 2021-02-12 ENCOUNTER — Other Ambulatory Visit: Payer: Self-pay

## 2021-02-12 DIAGNOSIS — I10 Essential (primary) hypertension: Secondary | ICD-10-CM | POA: Insufficient documentation

## 2021-02-12 DIAGNOSIS — E119 Type 2 diabetes mellitus without complications: Secondary | ICD-10-CM | POA: Insufficient documentation

## 2021-02-12 DIAGNOSIS — Z419 Encounter for procedure for purposes other than remedying health state, unspecified: Secondary | ICD-10-CM

## 2021-02-12 DIAGNOSIS — M5117 Intervertebral disc disorders with radiculopathy, lumbosacral region: Secondary | ICD-10-CM | POA: Insufficient documentation

## 2021-02-12 DIAGNOSIS — F17210 Nicotine dependence, cigarettes, uncomplicated: Secondary | ICD-10-CM | POA: Insufficient documentation

## 2021-02-12 DIAGNOSIS — K219 Gastro-esophageal reflux disease without esophagitis: Secondary | ICD-10-CM | POA: Insufficient documentation

## 2021-02-12 HISTORY — PX: LUMBAR LAMINECTOMY/DECOMPRESSION MICRODISCECTOMY: SHX5026

## 2021-02-12 LAB — GLUCOSE, CAPILLARY
Glucose-Capillary: 126 mg/dL — ABNORMAL HIGH (ref 70–99)
Glucose-Capillary: 121 mg/dL — ABNORMAL HIGH (ref 70–99)

## 2021-02-12 SURGERY — LUMBAR LAMINECTOMY/DECOMPRESSION MICRODISCECTOMY 1 LEVEL
Anesthesia: General | Site: Back | Laterality: Left

## 2021-02-12 MED ORDER — HYDROMORPHONE HCL 1 MG/ML IJ SOLN
INTRAMUSCULAR | Status: AC
  Filled 2021-02-12: qty 2

## 2021-02-12 MED ORDER — THROMBIN 5000 UNITS EX SOLR
OROMUCOSAL | Status: DC | PRN
  Administered 2021-02-12: 5 mL via TOPICAL

## 2021-02-12 MED ORDER — ACETAMINOPHEN 160 MG/5ML PO SOLN: 1000.0000 mg | Freq: Once | ORAL | Status: DC | PRN

## 2021-02-12 MED ORDER — ROCURONIUM BROMIDE 10 MG/ML (PF) SYRINGE
PREFILLED_SYRINGE | INTRAVENOUS | Status: DC | PRN
  Administered 2021-02-12: 70 mg via INTRAVENOUS
  Administered 2021-02-12: 30 mg via INTRAVENOUS

## 2021-02-12 MED ORDER — OXYCODONE HCL 5 MG PO TABS: 5.0000 mg | ORAL_TABLET | Freq: Once | ORAL | Status: DC | PRN

## 2021-02-12 MED ORDER — METHOCARBAMOL 500 MG PO TABS: 500.0000 mg | ORAL_TABLET | Freq: Three times a day (TID) | ORAL | 0 refills | Status: DC | PRN

## 2021-02-12 MED ORDER — THROMBIN 5000 UNITS EX SOLR
CUTANEOUS | Status: AC
  Filled 2021-02-12: qty 15000

## 2021-02-12 MED ORDER — METHYLPREDNISOLONE ACETATE 80 MG/ML IJ SUSP: INTRAMUSCULAR | Status: DC | PRN

## 2021-02-12 MED ORDER — FENTANYL CITRATE (PF) 100 MCG/2ML IJ SOLN
25.0000 ug | INTRAMUSCULAR | Status: DC | PRN
  Administered 2021-02-12 (×3): 50 ug via INTRAVENOUS

## 2021-02-12 MED ORDER — BUPIVACAINE HCL (PF) 0.5 % IJ SOLN
INTRAMUSCULAR | Status: DC | PRN
  Administered 2021-02-12: 5 mL

## 2021-02-12 MED ORDER — HYDROMORPHONE HCL 1 MG/ML IJ SOLN
0.2500 mg | INTRAMUSCULAR | Status: DC | PRN
  Administered 2021-02-12 (×2): 0.5 mg via INTRAVENOUS
  Administered 2021-02-12: 1 mg via INTRAVENOUS

## 2021-02-12 MED ORDER — OXYCODONE HCL 5 MG PO TABS
ORAL_TABLET | ORAL | Status: AC
  Filled 2021-02-12: qty 1

## 2021-02-12 MED ORDER — CHLORHEXIDINE GLUCONATE CLOTH 2 % EX PADS: 6.0000 | MEDICATED_PAD | Freq: Once | CUTANEOUS | Status: DC

## 2021-02-12 MED ORDER — ACETAMINOPHEN 500 MG PO TABS: 1000.0000 mg | ORAL_TABLET | Freq: Once | ORAL | Status: DC | PRN

## 2021-02-12 MED ORDER — OXYCODONE HCL 5 MG/5ML PO SOLN: 5.0000 mg | Freq: Once | ORAL | Status: DC | PRN

## 2021-02-12 MED ORDER — ORAL CARE MOUTH RINSE: 15.0000 mL | Freq: Once | OROMUCOSAL | Status: AC

## 2021-02-12 MED ORDER — ACETAMINOPHEN 10 MG/ML IV SOLN
1000.0000 mg | Freq: Once | INTRAVENOUS | Status: DC | PRN
  Administered 2021-02-12: 1000 mg via INTRAVENOUS

## 2021-02-12 MED ORDER — PROMETHAZINE HCL 25 MG/ML IJ SOLN
6.2500 mg | Freq: Once | INTRAMUSCULAR | Status: AC
  Administered 2021-02-12: 6.25 mg via INTRAVENOUS

## 2021-02-12 MED ORDER — ONDANSETRON HCL 4 MG/2ML IJ SOLN
INTRAMUSCULAR | Status: DC | PRN
  Administered 2021-02-12: 4 mg via INTRAVENOUS

## 2021-02-12 MED ORDER — OXYCODONE-ACETAMINOPHEN 10-325 MG PO TABS: 1.0000 | ORAL_TABLET | Freq: Four times a day (QID) | ORAL | 0 refills | Status: AC | PRN

## 2021-02-12 MED ORDER — PROPOFOL 10 MG/ML IV BOLUS
INTRAVENOUS | Status: DC | PRN
  Administered 2021-02-12: 20 mg via INTRAVENOUS
  Administered 2021-02-12: 150 mg via INTRAVENOUS

## 2021-02-12 MED ORDER — FENTANYL CITRATE (PF) 250 MCG/5ML IJ SOLN
INTRAMUSCULAR | Status: DC | PRN
  Administered 2021-02-12: 100 ug via INTRAVENOUS
  Administered 2021-02-12 (×2): 25 ug via INTRAVENOUS
  Administered 2021-02-12: 100 ug via INTRAVENOUS

## 2021-02-12 MED ORDER — SUGAMMADEX SODIUM 200 MG/2ML IV SOLN
INTRAVENOUS | Status: DC | PRN
  Administered 2021-02-12: 200 mg via INTRAVENOUS

## 2021-02-12 MED ORDER — LIDOCAINE-EPINEPHRINE 1 %-1:100000 IJ SOLN
INTRAMUSCULAR | Status: DC | PRN
  Administered 2021-02-12: 5 mL

## 2021-02-12 MED ORDER — MIDAZOLAM HCL 5 MG/5ML IJ SOLN
INTRAMUSCULAR | Status: DC | PRN
  Administered 2021-02-12: 2 mg via INTRAVENOUS

## 2021-02-12 MED ORDER — LIDOCAINE-EPINEPHRINE 1 %-1:100000 IJ SOLN
INTRAMUSCULAR | Status: AC
  Filled 2021-02-12: qty 1

## 2021-02-12 MED ORDER — PROPOFOL 10 MG/ML IV BOLUS
INTRAVENOUS | Status: AC
  Filled 2021-02-12: qty 20

## 2021-02-12 MED ORDER — PROMETHAZINE HCL 25 MG/ML IJ SOLN
INTRAMUSCULAR | Status: AC
  Filled 2021-02-12: qty 1

## 2021-02-12 MED ORDER — CEFAZOLIN SODIUM-DEXTROSE 2-4 GM/100ML-% IV SOLN
2.0000 g | INTRAVENOUS | Status: AC
  Administered 2021-02-12: 2 g via INTRAVENOUS
  Filled 2021-02-12: qty 100

## 2021-02-12 MED ORDER — CHLORHEXIDINE GLUCONATE 0.12 % MT SOLN
15.0000 mL | Freq: Once | OROMUCOSAL | Status: AC
  Administered 2021-02-12: 15 mL via OROMUCOSAL
  Filled 2021-02-12: qty 15

## 2021-02-12 MED ORDER — LACTATED RINGERS IV SOLN: INTRAVENOUS | Status: DC

## 2021-02-12 MED ORDER — MIDAZOLAM HCL 2 MG/2ML IJ SOLN
INTRAMUSCULAR | Status: AC
  Filled 2021-02-12: qty 2

## 2021-02-12 MED ORDER — METHYLPREDNISOLONE ACETATE 80 MG/ML IJ SUSP
INTRAMUSCULAR | Status: AC
  Filled 2021-02-12: qty 1

## 2021-02-12 MED ORDER — ACETAMINOPHEN 10 MG/ML IV SOLN
INTRAVENOUS | Status: AC
  Filled 2021-02-12: qty 100

## 2021-02-12 MED ORDER — BUPIVACAINE HCL (PF) 0.5 % IJ SOLN
INTRAMUSCULAR | Status: AC
  Filled 2021-02-12: qty 30

## 2021-02-12 MED ORDER — HEMOSTATIC AGENTS (NO CHARGE) OPTIME
TOPICAL | Status: DC | PRN
  Administered 2021-02-12: 1 via TOPICAL

## 2021-02-12 MED ORDER — DEXAMETHASONE SODIUM PHOSPHATE 10 MG/ML IJ SOLN
INTRAMUSCULAR | Status: DC | PRN
  Administered 2021-02-12: 10 mg via INTRAVENOUS

## 2021-02-12 MED ORDER — FENTANYL CITRATE (PF) 250 MCG/5ML IJ SOLN
INTRAMUSCULAR | Status: AC
  Filled 2021-02-12: qty 5

## 2021-02-12 MED ORDER — FENTANYL CITRATE (PF) 100 MCG/2ML IJ SOLN
INTRAMUSCULAR | Status: AC
  Filled 2021-02-12: qty 4

## 2021-02-12 MED ORDER — 0.9 % SODIUM CHLORIDE (POUR BTL) OPTIME
TOPICAL | Status: DC | PRN
  Administered 2021-02-12: 1000 mL

## 2021-02-12 SURGICAL SUPPLY — 61 items
ADH SKN CLS APL DERMABOND .7 (GAUZE/BANDAGES/DRESSINGS) ×1
APL SKNCLS STERI-STRIP NONHPOA (GAUZE/BANDAGES/DRESSINGS)
BAG COUNTER SPONGE SURGICOUNT (BAG) ×2 IMPLANT
BAG SPNG CNTER NS LX DISP (BAG) ×1
BAND INSRT 18 STRL LF DISP RB (MISCELLANEOUS) ×2
BAND RUBBER #18 3X1/16 STRL (MISCELLANEOUS) ×4 IMPLANT
BENZOIN TINCTURE PRP APPL 2/3 (GAUZE/BANDAGES/DRESSINGS) IMPLANT
BLADE CLIPPER SURG (BLADE) IMPLANT
BLADE SURG 11 STRL SS (BLADE) ×2 IMPLANT
BUR MATCHSTICK NEURO 3.0 LAGG (BURR) ×2 IMPLANT
BUR PRECISION FLUTE 5.0 (BURR) IMPLANT
CANISTER SUCT 3000ML PPV (MISCELLANEOUS) ×2 IMPLANT
CARTRIDGE OIL MAESTRO DRILL (MISCELLANEOUS) ×1 IMPLANT
DECANTER SPIKE VIAL GLASS SM (MISCELLANEOUS) ×1 IMPLANT
DERMABOND ADVANCED (GAUZE/BANDAGES/DRESSINGS) ×1
DERMABOND ADVANCED .7 DNX12 (GAUZE/BANDAGES/DRESSINGS) ×1 IMPLANT
DIFFUSER DRILL AIR PNEUMATIC (MISCELLANEOUS) ×2 IMPLANT
DRAPE LAPAROTOMY 100X72X124 (DRAPES) ×2 IMPLANT
DRAPE MICROSCOPE LEICA (MISCELLANEOUS) ×2 IMPLANT
DRAPE SURG 17X23 STRL (DRAPES) ×1 IMPLANT
DRSG OPSITE POSTOP 3X4 (GAUZE/BANDAGES/DRESSINGS) ×2 IMPLANT
DRSG OPSITE POSTOP 4X6 (GAUZE/BANDAGES/DRESSINGS) ×1 IMPLANT
DURAPREP 26ML APPLICATOR (WOUND CARE) ×2 IMPLANT
ELECT REM PT RETURN 9FT ADLT (ELECTROSURGICAL) ×2
ELECTRODE REM PT RTRN 9FT ADLT (ELECTROSURGICAL) ×1 IMPLANT
GAUZE 4X4 16PLY ~~LOC~~+RFID DBL (SPONGE) ×1 IMPLANT
GAUZE SPONGE 4X4 12PLY STRL (GAUZE/BANDAGES/DRESSINGS) IMPLANT
GLOVE EXAM NITRILE XL STR (GLOVE) IMPLANT
GLOVE SURG ENC MOIS LTX SZ7.5 (GLOVE) IMPLANT
GLOVE SURG LTX SZ7 (GLOVE) ×2 IMPLANT
GLOVE SURG UNDER POLY LF SZ7.5 (GLOVE) ×4 IMPLANT
GOWN STRL REUS W/ TWL LRG LVL3 (GOWN DISPOSABLE) ×2 IMPLANT
GOWN STRL REUS W/ TWL XL LVL3 (GOWN DISPOSABLE) IMPLANT
GOWN STRL REUS W/TWL 2XL LVL3 (GOWN DISPOSABLE) ×1 IMPLANT
GOWN STRL REUS W/TWL LRG LVL3 (GOWN DISPOSABLE) ×2
GOWN STRL REUS W/TWL XL LVL3 (GOWN DISPOSABLE) ×2
HEMOSTAT POWDER KIT SURGIFOAM (HEMOSTASIS) ×2 IMPLANT
KIT BASIN OR (CUSTOM PROCEDURE TRAY) ×2 IMPLANT
KIT TURNOVER KIT B (KITS) ×2 IMPLANT
NDL HYPO 18GX1.5 BLUNT FILL (NEEDLE) IMPLANT
NDL SPNL 18GX3.5 QUINCKE PK (NEEDLE) IMPLANT
NEEDLE HYPO 18GX1.5 BLUNT FILL (NEEDLE) ×2 IMPLANT
NEEDLE HYPO 22GX1.5 SAFETY (NEEDLE) ×2 IMPLANT
NEEDLE SPNL 18GX3.5 QUINCKE PK (NEEDLE) ×2 IMPLANT
NS IRRIG 1000ML POUR BTL (IV SOLUTION) ×2 IMPLANT
OIL CARTRIDGE MAESTRO DRILL (MISCELLANEOUS) ×2
PACK LAMINECTOMY NEURO (CUSTOM PROCEDURE TRAY) ×2 IMPLANT
PAD ARMBOARD 7.5X6 YLW CONV (MISCELLANEOUS) ×6 IMPLANT
PENCIL BUTTON HOLSTER BLD 10FT (ELECTRODE) ×1 IMPLANT
SEALANT ADHERUS EXTEND TIP (MISCELLANEOUS) ×1 IMPLANT
SPONGE SURGIFOAM ABS GEL SZ50 (HEMOSTASIS) ×1 IMPLANT
SPONGE T-LAP 4X18 ~~LOC~~+RFID (SPONGE) ×1 IMPLANT
STRIP CLOSURE SKIN 1/2X4 (GAUZE/BANDAGES/DRESSINGS) IMPLANT
SUT VIC AB 0 CT1 18XCR BRD8 (SUTURE) ×1 IMPLANT
SUT VIC AB 0 CT1 8-18 (SUTURE) ×2
SUT VIC AB 2-0 CT1 18 (SUTURE) ×1 IMPLANT
SUT VICRYL 3-0 RB1 18 ABS (SUTURE) ×4 IMPLANT
SYR 3ML LL SCALE MARK (SYRINGE) ×1 IMPLANT
TOWEL GREEN STERILE (TOWEL DISPOSABLE) ×2 IMPLANT
TOWEL GREEN STERILE FF (TOWEL DISPOSABLE) ×2 IMPLANT
WATER STERILE IRR 1000ML POUR (IV SOLUTION) ×2 IMPLANT

## 2021-02-12 NOTE — Anesthesia Procedure Notes (Addendum)
Procedure Name: Intubation Date/Time: 02/12/2021 11:00 AM Performed by: Wilburn Cornelia, CRNA Pre-anesthesia Checklist: Patient identified, Emergency Drugs available, Suction available and Patient being monitored Patient Re-evaluated:Patient Re-evaluated prior to induction Oxygen Delivery Method: Circle System Utilized Preoxygenation: Pre-oxygenation with 100% oxygen Induction Type: IV induction Ventilation: Mask ventilation without difficulty Laryngoscope Size: Mac and 3 Grade View: Grade I Tube type: Oral Tube size: 7.0 mm Number of attempts: 1 Airway Equipment and Method: Stylet and Oral airway Placement Confirmation: ETT inserted through vocal cords under direct vision, positive ETCO2, breath sounds checked- equal and bilateral and CO2 detector Secured at: 21 cm Tube secured with: Tape Dental Injury: Teeth and Oropharynx as per pre-operative assessment

## 2021-02-12 NOTE — Discharge Summary (Signed)
  Physician Discharge Summary  Patient ID: Susan Velasquez MRN: [id number] DOB/AGE: [birth year] 55 y.o.  Admit date: 02/12/2021 Discharge date: 02/12/2021  Admission Diagnoses: Lumbar disc herniation with radiculopathy, left L5-S1  Discharge Diagnoses: Same Active Problems:   * No active hospital problems. *   Discharged Condition: Stable  Hospital Course:  [name] is a 55 y.o. female who underwent elective left L5-S1 laminotomy and microdiscectomy which was done without complication. Postop the patient was at her neurologic baseline. Back pain was controlled with oral medication, she was ambulating without difficulty, voiding normally, and tolerating diet.  Treatments: Surgery - left L5-S1 laminotomy, microdiscectomy  Discharge Exam: Blood pressure (!) 154/81, pulse 99, temperature 97.8 F (36.6 C), temperature source Oral, resp. rate 18, height 5\' 7"  (1.702 m), weight 97.5 kg, SpO2 99 %. Awake, alert, oriented Speech fluent, appropriate CN grossly intact 5/5 BUE/BLE Wound c/d/i  Follow-up: Follow-up in my office [facility] [phone]) in 2-3 weeks  Disposition: Discharge disposition: 01-Home or Self Care       Discharge Instructions     Call MD for:  redness, tenderness, or signs of infection (pain, swelling, redness, odor or green/yellow discharge around incision site)   Complete by: As directed    Call MD for:  temperature >100.4   Complete by: As directed    Diet - low sodium heart healthy   Complete by: As directed    Discharge instructions   Complete by: As directed    Walk at home as much as possible, at least 4 times / day   Increase activity slowly   Complete by: As directed    Lifting restrictions   Complete by: As directed    No lifting > 10 lbs   May shower / Bathe   Complete by: As directed    48 hours after surgery   May walk up steps   Complete by: As directed    Other Restrictions   Complete by: As directed     No bending/twisting at waist   Remove dressing in 48 hours   Complete by: As directed       Allergies as of 02/12/2021   No Known Allergies      Medication List     TAKE these medications    amLODipine 10 MG tablet Commonly known as: NORVASC Take 1 tablet (10 mg total) by mouth daily.   atorvastatin 20 MG tablet Commonly known as: LIPITOR Take 1 tablet (20 mg total) by mouth daily.   methocarbamol 500 MG tablet Commonly known as: Robaxin Take 1 tablet (500 mg total) by mouth every 8 (eight) hours as needed for muscle spasms.   omeprazole 40 MG capsule Commonly known as: PRILOSEC Take 1 capsule (40 mg total) by mouth daily.   oxyCODONE-acetaminophen 10-325 MG tablet Commonly known as: Percocet Take 1 tablet by mouth every 6 (six) hours as needed for up to 7 days for pain.         Signed14/11/2020 02/12/2021, 12:15 PM

## 2021-02-12 NOTE — Anesthesia Preprocedure Evaluation (Signed)
Anesthesia Evaluation  Patient identified by MRN, date of birth, ID band Patient awake    Reviewed: Allergy & Precautions, NPO status , Patient's Chart, lab work & pertinent test results  History of Anesthesia Complications Negative for: history of anesthetic complications  Airway Mallampati: II  TM Distance: >3 FB Neck ROM: Full    Dental  (+) Dental Advisory Given, Chipped   Pulmonary neg shortness of breath, neg sleep apnea, neg COPD, neg recent URI, Current Smoker and Patient abstained from smoking.,    breath sounds clear to auscultation       Cardiovascular hypertension, Pt. on medications (-) angina(-) Past MI and (-) CHF  Rhythm:Regular     Neuro/Psych negative neurological ROS  negative psych ROS   GI/Hepatic Neg liver ROS, GERD  ,  Endo/Other  diabetes  Renal/GU negative Renal ROS     Musculoskeletal negative musculoskeletal ROS (+)   Abdominal   Peds  Hematology negative hematology ROS (+)   Anesthesia Other Findings   Reproductive/Obstetrics                             Anesthesia Physical Anesthesia Plan  ASA: 2  Anesthesia Plan: General   Post-op Pain Management:    Induction: Intravenous  PONV Risk Score and Plan: 2 and Ondansetron and Dexamethasone  Airway Management Planned: Oral ETT  Additional Equipment: None  Intra-op Plan:   Post-operative Plan: Extubation in OR  Informed Consent: I have reviewed the patients History and Physical, chart, labs and discussed the procedure including the risks, benefits and alternatives for the proposed anesthesia with the patient or authorized representative who has indicated his/her understanding and acceptance.     Dental advisory given  Plan Discussed with: CRNA and Anesthesiologist  Anesthesia Plan Comments:         Anesthesia Quick Evaluation

## 2021-02-12 NOTE — H&P (Signed)
  Chief Complaint  Back and left leg pain  History of Present Illness  Susan Velasquez is a 55 y.o. female initially seen in the outpatient neurosurgery clinic complaining of left-sided back and leg pain starting back in June.  Patient describes pain which radiates through the left side of her back and the back of her leg including the calf and some paresthesias in the toes.  Pain is significantly worsened over the last several weeks.  She has been taking over-the-counter anti-inflammatories as well as steroids without any significant improvement.  She does not have any right leg symptoms.  Past Medical History   Past Medical History:  Diagnosis Date   GERD (gastroesophageal reflux disease)    Hypertension    Pneumonia    Pre-diabetes     Past Surgical History   Past Surgical History:  Procedure Laterality Date   CHOLECYSTECTOMY     25 yrs ago   TUBAL LIGATION      Social History   Social History   Tobacco Use   Smoking status: Every Day    Packs/day: 0.50    Types: Cigarettes   Smokeless tobacco: Never  Vaping Use   Vaping Use: Never used  Substance Use Topics   Alcohol use: Not Currently    Comment: history social etoh   Drug use: Not Currently    Types: Marijuana    Comment: none since 2012    Medications   Prior to Admission medications   Medication Sig Start Date End Date Taking? Authorizing Provider  amLODipine (NORVASC) 10 MG tablet Take 1 tablet (10 mg total) by mouth daily. 01/11/21  Yes Jacquelin Hawking, PA-C  atorvastatin (LIPITOR) 20 MG tablet Take 1 tablet (20 mg total) by mouth daily. 01/11/21  Yes Jacquelin Hawking, PA-C  omeprazole (PRILOSEC) 40 MG capsule Take 1 capsule (40 mg total) by mouth daily. 01/11/21  Yes Jacquelin Hawking, PA-C    Allergies  No Known Allergies  Review of Systems  ROS  Neurologic Exam  Awake, alert, oriented Memory and concentration grossly intact Speech fluent, appropriate CN grossly intact Motor exam: Upper  Extremities Deltoid Bicep Tricep Grip  Right 5/5 5/5 5/5 5/5  Left 5/5 5/5 5/5 5/5   Lower Extremities IP Quad PF DF EHL  Right 5/5 5/5 5/5 5/5 5/5  Left 5/5 5/5 5/5 5/5 5/5   Sensation grossly intact to LT  Imaging  MRI of the lumbar spine dated 11/05/2020 was personally reviewed and demonstrates maintenance of lumbar lordosis.  Primary finding is at L5-S1 where there is a relatively large left eccentric disc herniation with compression of the traversing left S1 nerve root.  Impression  - 55 y.o. female with left-sided back and leg pain consistent with lumbar radiculopathy related to left eccentric disc herniation at L5-S1  Plan  -We will plan on proceeding with a left-sided laminotomy and microdiscectomy at L5-S1  I have reviewed the treatment options in detail with the patient in the office.  We have discussed details of the surgery as well as the expected postoperative course and recovery.  We have also reviewed the associated risks, benefits, and alternatives to surgery.  All her questions today were answered and informed consent was obtained and witnessed.  Lisbeth Renshaw, MD [facility]

## 2021-02-12 NOTE — Progress Notes (Signed)
Susan Velasquez has been in a recliner for 30+ minutes with better pain control sitting up vs on stretcher. Episodic n/v corrected with IVF's and meds. Feels her pain is now tolerable/manageable, "I just feel drunk".

## 2021-02-12 NOTE — Transfer of Care (Signed)
Immediate Anesthesia Transfer of Care Note  Patient: Susan Velasquez  Procedure(s) Performed: MICRODISCECTOMY LUMBAR FIVE-SACRAL ONE (Left: Back)  Patient Location: PACU  Anesthesia Type:General  Level of Consciousness: awake, alert  and oriented  Airway & Oxygen Therapy: Patient Spontanous Breathing and Patient connected to nasal cannula oxygen  Post-op Assessment: Report given to RN and Post -op Vital signs reviewed and stable  Post vital signs: Reviewed and stable  Last Vitals:  Vitals Value Taken Time  BP 153/105 02/12/21 1227  Temp    Pulse 94 02/12/21 1227  Resp 12 02/12/21 1227  SpO2 98 % 02/12/21 1227  Vitals shown include unvalidated device data.  Last Pain:  Vitals:   02/12/21 0854  TempSrc:   PainSc: 0-No pain         Complications: No notable events documented.

## 2021-02-15 ENCOUNTER — Encounter (HOSPITAL_COMMUNITY): Payer: Self-pay | Admitting: Neurosurgery

## 2021-02-15 NOTE — Anesthesia Postprocedure Evaluation (Signed)
Anesthesia Post Note  Patient: Susan Velasquez  Procedure(s) Performed: MICRODISCECTOMY LUMBAR FIVE-SACRAL ONE (Left: Back)     Patient location during evaluation: PACU Anesthesia Type: General Level of consciousness: awake and alert Pain management: pain level controlled Vital Signs Assessment: post-procedure vital signs reviewed and stable Respiratory status: spontaneous breathing, nonlabored ventilation, respiratory function stable and patient connected to nasal cannula oxygen Cardiovascular status: blood pressure returned to baseline and stable Postop Assessment: no apparent nausea or vomiting Anesthetic complications: no   No notable events documented.  Last Vitals:  Vitals:   02/12/21 1300 02/12/21 1323  BP:  127/68  Pulse:  95  Resp: 14 13  Temp:  36.4 C  SpO2:  96%    Last Pain:  Vitals:   02/12/21 1300  TempSrc:   PainSc: 6                  Bhavik Cabiness

## 2021-02-16 NOTE — Op Note (Signed)
°  NEUROSURGERY OPERATIVE NOTE   PREOP DIAGNOSIS: Lumbar disc herniation, left L5-S1  POSTOP DIAGNOSIS: Same  PROCEDURE: 1. Left L5-S1 laminotomy and microdiscectomy for decompression of nerve root 2. Use of operating microscope  SURGEON: Dr. Lisbeth Renshaw, MD  ASSISTANT: Dr. Monia Pouch, MD  ANESTHESIA: General Endotracheal  EBL: See anesthesia records  SPECIMENS: None  DRAINS: None  COMPLICATIONS: None immediate  CONDITION: Hemodynamically stable to PACU  HISTORY: Susan Velasquez is a 55 y.o. female initially seen in the outpatient neurosurgery clinic with primarily left-sided back and leg pain starting in June of this year.  Pain significantly worsened over the last several weeks.  Her imaging did reveal a left-sided L5-S1 disc herniation.  After lengthy discussion of treatment options and a trial of conservative treatments, she elected to proceed with surgical decompression.  The risks, benefits, and alternatives to surgery were all reviewed in detail with the patient.  After all her questions were answered informed consent was obtained and witnessed.  PROCEDURE IN DETAIL: After informed consent was obtained and witnessed, the patient was brought to the operating room. After induction of general anesthesia, the patient was positioned on the operative table in the prone position with all pressure points meticulously padded. The skin of the low back was then prepped and draped in the usual sterile fashion.  After timeout was conducted, the skin was infiltrated with local anesthetic.  Spinal needle was then introduced in order to identify the surface projection of the L5-S1 interspace on lateral x-ray.  Skin incision was then made sharply and Bovie electrocautery was used to dissect the subcutaneous tissue until the lumbodorsal fascia was identified. The fashion was then incised using Bovie electrocautery and the lamina at the L5 level was identified and dissection was carried out  in the subperiosteal plane. Self-retaining retractor was then placed, and intraoperative x-ray was taken with a dissector in the L5-S1 interspace to confirm we were at the correct level.  Using a high-speed drill, laminotomy was completed with a partial medial facetectomy. The ligamentum flavum was then identified and removed and the lateral edge of the thecal sac was identified. This was then traced down to identify the traversing nerve root. Dissection was then carried out in the ventral epidural space and a relatively large left eccentric disc herniation underneath the posterior longitudinal ligament was identified.  The posterior annulus was then incised and using a combination of dissectors, curettes, and rongeurs, the herniated disc fragment was removed.  The decompression of the nerve root was confirmed using a dissector.  During dissection in the ventral epidural space with a blunt coronary dilator, I did notice a small amount of CSF.  A very small durotomy was noted on the ventral lateral surface of the thecal sac.  The arachnoid did not appear to be violated.  Once the decompression was completed, there really was not any further CSF egress.  Nonetheless, after hemostasis was secured I covered the dura with a layer of polyethylene glycol dural sealant.  Self-retaining retractor was then removed, and the wound is closed in layers using a combination of interrupted 0 Vicryl and 3-0 Vicryl stitches. The skin was closed using standard skin glue.  At the end of the case all sponge, needle, and instrument counts were correct. The patient was then transferred to the stretcher and taken to the postanesthesia care unit in stable hemodynamic condition.  Lisbeth Renshaw, MD [facility]

## 2021-02-22 ENCOUNTER — Ambulatory Visit: Payer: Self-pay | Admitting: Physician Assistant

## 2021-04-01 ENCOUNTER — Other Ambulatory Visit: Payer: Self-pay | Admitting: Physician Assistant

## 2021-04-01 DIAGNOSIS — E785 Hyperlipidemia, unspecified: Secondary | ICD-10-CM

## 2021-04-01 DIAGNOSIS — I10 Essential (primary) hypertension: Secondary | ICD-10-CM

## 2021-04-12 ENCOUNTER — Other Ambulatory Visit: Payer: Self-pay | Admitting: Physician Assistant

## 2021-04-12 ENCOUNTER — Ambulatory Visit: Payer: Self-pay | Admitting: Physician Assistant

## 2021-04-12 MED ORDER — OMEPRAZOLE 40 MG PO CPDR: 40.0000 mg | DELAYED_RELEASE_CAPSULE | Freq: Every day | ORAL | 1 refills | Status: DC

## 2021-04-12 MED ORDER — AMLODIPINE BESYLATE 10 MG PO TABS: 10.0000 mg | ORAL_TABLET | Freq: Every day | ORAL | 1 refills | Status: DC

## 2021-04-12 MED ORDER — ATORVASTATIN CALCIUM 20 MG PO TABS: 20.0000 mg | ORAL_TABLET | Freq: Every day | ORAL | 1 refills | Status: DC

## 2021-08-09 ENCOUNTER — Other Ambulatory Visit (HOSPITAL_COMMUNITY)
Admission: RE | Admit: 2021-08-09 | Discharge: 2021-08-09 | Disposition: A | Discharge status: Home / Self Care | Payer: Self-pay | Source: Ambulatory Visit | Attending: Physician Assistant | Admitting: Physician Assistant

## 2021-08-09 DIAGNOSIS — I10 Essential (primary) hypertension: Secondary | ICD-10-CM

## 2021-08-09 DIAGNOSIS — E785 Hyperlipidemia, unspecified: Secondary | ICD-10-CM

## 2021-08-09 LAB — LIPID PANEL
Cholesterol: 190 mg/dL (ref 0–200)
HDL: 38 mg/dL — ABNORMAL LOW (ref >40)
LDL Cholesterol: 118 mg/dL — ABNORMAL HIGH (ref 0–99)
Total CHOL/HDL Ratio: 5 RATIO
Triglycerides: 171 mg/dL — ABNORMAL HIGH (ref <150)
VLDL: 34 mg/dL (ref 0–40)

## 2021-08-09 LAB — COMPREHENSIVE METABOLIC PANEL
ALT: 18 U/L (ref 0–44)
AST: 14 U/L — ABNORMAL LOW (ref 15–41)
Albumin: 4 g/dL (ref 3.5–5.0)
Alkaline Phosphatase: 109 U/L (ref 38–126)
Anion gap: 2 — ABNORMAL LOW (ref 5–15)
BUN: 13 mg/dL (ref 6–20)
CO2: 26 mmol/L (ref 22–32)
Calcium: 9.1 mg/dL (ref 8.9–10.3)
Chloride: 108 mmol/L (ref 98–111)
Creatinine, Ser: 0.99 mg/dL (ref 0.44–1.00)
GFR, Estimated: >60 mL/min (ref >60)
Glucose, Bld: 114 mg/dL — ABNORMAL HIGH (ref 70–99)
Potassium: 4 mmol/L (ref 3.5–5.1)
Sodium: 136 mmol/L (ref 135–145)
Total Bilirubin: 0.4 mg/dL (ref 0.3–1.2)
Total Protein: 7.5 g/dL (ref 6.5–8.1)

## 2021-08-11 ENCOUNTER — Encounter: Payer: Self-pay | Admitting: Physician Assistant

## 2021-08-11 ENCOUNTER — Ambulatory Visit: Payer: Self-pay | Admitting: Physician Assistant

## 2021-08-11 VITALS — BP 130/80 | HR 82 | Temp 97.0°F | Wt 212.0 lb

## 2021-08-11 DIAGNOSIS — I10 Essential (primary) hypertension: Secondary | ICD-10-CM

## 2021-08-11 DIAGNOSIS — K219 Gastro-esophageal reflux disease without esophagitis: Secondary | ICD-10-CM

## 2021-08-11 DIAGNOSIS — F17200 Nicotine dependence, unspecified, uncomplicated: Secondary | ICD-10-CM

## 2021-08-11 DIAGNOSIS — E785 Hyperlipidemia, unspecified: Secondary | ICD-10-CM

## 2021-08-11 MED ORDER — ATORVASTATIN CALCIUM 20 MG PO TABS: 20.0000 mg | ORAL_TABLET | Freq: Every day | ORAL | 0 refills | Status: DC

## 2021-08-11 MED ORDER — AMLODIPINE BESYLATE 10 MG PO TABS: 10.0000 mg | ORAL_TABLET | Freq: Every day | ORAL | 0 refills | Status: DC

## 2021-08-11 MED ORDER — OMEPRAZOLE 40 MG PO CPDR: 40.0000 mg | DELAYED_RELEASE_CAPSULE | Freq: Every day | ORAL | 0 refills | Status: DC

## 2021-08-11 NOTE — Progress Notes (Signed)
BP 130/80   Pulse 82   Temp (!) 97 F (36.1 C)   Wt 212 lb (96.2 kg)   SpO2 98%   BMI 33.20 kg/m    Subjective:    Patient ID: Susan Velasquez, female    DOB: 10-05-1965, 56 y.o.   MRN: [id number]  HPI: Susan Velasquez is a 56 y.o. female presenting on 08/11/2021 for Hypertension and Hyperlipidemia   HPI  Chief Complaint  Patient presents with   Hypertension   Hyperlipidemia    Pt is 55yoF who was Last seen here 02/09/21.  She had Back surgery in december.  She says the neurosurgeon signed off.  Her husband recently died after an illness.  She has 2 daughters and some grandchildren who live close to her and are supportive.  She shays she is doing okay.  She says she takes it Day to day.  Some days are good and others are difficult.  She says she is Sleeping okay.     She returned to work at [employer] 2 days / wk    Relevant past medical, surgical, family and social history reviewed and updated as indicated. Interim medical history since our last visit reviewed. Allergies and medications reviewed and updated.   Current Outpatient Medications:    amLODipine (NORVASC) 10 MG tablet, Take 1 tablet (10 mg total) by mouth daily., Disp: 30 tablet, Rfl: 1   atorvastatin (LIPITOR) 20 MG tablet, Take 1 tablet (20 mg total) by mouth daily., Disp: 30 tablet, Rfl: 1   omeprazole (PRILOSEC) 40 MG capsule, Take 1 capsule (40 mg total) by mouth daily., Disp: 30 capsule, Rfl: 1   methocarbamol (ROBAXIN) 500 MG tablet, Take 1 tablet (500 mg total) by mouth every 8 (eight) hours as needed for muscle spasms. (Patient not taking: Reported on 08/11/2021), Disp: 60 tablet, Rfl: 0    Review of Systems  Per HPI unless specifically indicated above     Objective:    BP 130/80   Pulse 82   Temp (!) 97 F (36.1 C)   Wt 212 lb (96.2 kg)   SpO2 98%   BMI 33.20 kg/m   Wt Readings from Last 3 Encounters:  08/11/21 212 lb (96.2 kg)  02/12/21 215 lb (97.5 kg)  02/09/21 215 lb (97.5 kg)     Physical Exam Vitals reviewed.  Constitutional:      General: She is not in acute distress.    Appearance: She is well-developed. She is not toxic-appearing.  HENT:     Head: Normocephalic and atraumatic.  Cardiovascular:     Rate and Rhythm: Normal rate and regular rhythm.  Pulmonary:     Effort: Pulmonary effort is normal.     Breath sounds: Normal breath sounds.  Abdominal:     General: Bowel sounds are normal.     Palpations: Abdomen is soft. There is no mass.     Tenderness: There is no abdominal tenderness.  Musculoskeletal:     Cervical back: Neck supple.     Right lower leg: No edema.     Left lower leg: No edema.  Lymphadenopathy:     Cervical: No cervical adenopathy.  Skin:    General: Skin is warm and dry.  Neurological:     Mental Status: She is alert and oriented to person, place, and time.  Psychiatric:        Behavior: Behavior normal.     Results for orders placed or performed during the hospital encounter of  08/09/21  Lipid panel  Result Value Ref Range   Cholesterol 190 0 - 200 mg/dL   Triglycerides 323 (H) <150 mg/dL   HDL 38 (L) >55 mg/dL   Total CHOL/HDL Ratio 5.0 RATIO   VLDL 34 0 - 40 mg/dL   LDL Cholesterol 732 (H) 0 - 99 mg/dL  Comprehensive metabolic panel  Result Value Ref Range   Sodium 136 135 - 145 mmol/L   Potassium 4.0 3.5 - 5.1 mmol/L   Chloride 108 98 - 111 mmol/L   CO2 26 22 - 32 mmol/L   Glucose, Bld 114 (H) 70 - 99 mg/dL   BUN 13 6 - 20 mg/dL   Creatinine, Ser 2.02 0.44 - 1.00 mg/dL   Calcium 9.1 8.9 - 54.2 mg/dL   Total Protein 7.5 6.5 - 8.1 g/dL   Albumin 4.0 3.5 - 5.0 g/dL   AST 14 (L) 15 - 41 U/L   ALT 18 0 - 44 U/L   Alkaline Phosphatase 109 38 - 126 U/L   Total Bilirubin 0.4 0.3 - 1.2 mg/dL   GFR, Estimated >70 >62 mL/min   Anion gap 2 (L) 5 - 15      Assessment & Plan:   Encounter Diagnoses  Name Primary?   Primary hypertension Yes   Hyperlipidemia, unspecified hyperlipidemia type    Gastroesophageal  reflux disease, unspecified whether esophagitis present    Tobacco use disorder      -reviewed labs with pt -pt to continue current medications -pt was offered counseling appointment but says she is okay now.  She will keep it in mind if she feels differently at a later time -pt to follow up 3 months.  Will update PAP at that time.  Pt to contact office sooner prn

## 2021-10-27 ENCOUNTER — Other Ambulatory Visit: Payer: Self-pay | Admitting: Physician Assistant

## 2021-10-27 DIAGNOSIS — E785 Hyperlipidemia, unspecified: Secondary | ICD-10-CM

## 2021-10-27 DIAGNOSIS — I10 Essential (primary) hypertension: Secondary | ICD-10-CM

## 2021-11-09 ENCOUNTER — Other Ambulatory Visit (HOSPITAL_COMMUNITY)
Admission: RE | Admit: 2021-11-09 | Discharge: 2021-11-09 | Disposition: A | Discharge status: Home / Self Care | Payer: Self-pay | Source: Ambulatory Visit | Attending: Physician Assistant | Admitting: Physician Assistant

## 2021-11-09 DIAGNOSIS — I10 Essential (primary) hypertension: Secondary | ICD-10-CM | POA: Insufficient documentation

## 2021-11-09 DIAGNOSIS — E785 Hyperlipidemia, unspecified: Secondary | ICD-10-CM | POA: Insufficient documentation

## 2021-11-09 LAB — COMPREHENSIVE METABOLIC PANEL
ALT: 16 U/L (ref 0–44)
AST: 15 U/L (ref 15–41)
Albumin: 4 g/dL (ref 3.5–5.0)
Alkaline Phosphatase: 121 U/L (ref 38–126)
Anion gap: 6 (ref 5–15)
BUN: 9 mg/dL (ref 6–20)
CO2: 27 mmol/L (ref 22–32)
Calcium: 9.6 mg/dL (ref 8.9–10.3)
Chloride: 106 mmol/L (ref 98–111)
Creatinine, Ser: 0.93 mg/dL (ref 0.44–1.00)
GFR, Estimated: >60 mL/min (ref >60)
Glucose, Bld: 108 mg/dL — ABNORMAL HIGH (ref 70–99)
Potassium: 4.3 mmol/L (ref 3.5–5.1)
Sodium: 139 mmol/L (ref 135–145)
Total Bilirubin: 0.6 mg/dL (ref 0.3–1.2)
Total Protein: 7.8 g/dL (ref 6.5–8.1)

## 2021-11-09 LAB — LIPID PANEL
Cholesterol: 179 mg/dL (ref 0–200)
HDL: 37 mg/dL — ABNORMAL LOW (ref >40)
LDL Cholesterol: 112 mg/dL — ABNORMAL HIGH (ref 0–99)
Total CHOL/HDL Ratio: 4.8 RATIO
Triglycerides: 152 mg/dL — ABNORMAL HIGH (ref <150)
VLDL: 30 mg/dL (ref 0–40)

## 2021-11-10 ENCOUNTER — Ambulatory Visit: Payer: Self-pay | Admitting: Physician Assistant

## 2021-11-10 ENCOUNTER — Other Ambulatory Visit (HOSPITAL_COMMUNITY)
Admission: RE | Admit: 2021-11-10 | Discharge: 2021-11-10 | Disposition: A | Discharge status: Home / Self Care | Payer: Self-pay | Source: Ambulatory Visit | Attending: Physician Assistant | Admitting: Physician Assistant

## 2021-11-10 ENCOUNTER — Encounter: Payer: Self-pay | Admitting: Physician Assistant

## 2021-11-10 VITALS — BP 134/86 | HR 90 | Temp 96.1°F | Wt 214.0 lb

## 2021-11-10 DIAGNOSIS — E669 Obesity, unspecified: Secondary | ICD-10-CM

## 2021-11-10 DIAGNOSIS — E785 Hyperlipidemia, unspecified: Secondary | ICD-10-CM

## 2021-11-10 DIAGNOSIS — F17200 Nicotine dependence, unspecified, uncomplicated: Secondary | ICD-10-CM

## 2021-11-10 DIAGNOSIS — I10 Essential (primary) hypertension: Secondary | ICD-10-CM

## 2021-11-10 DIAGNOSIS — Z124 Encounter for screening for malignant neoplasm of cervix: Secondary | ICD-10-CM

## 2021-11-10 DIAGNOSIS — R011 Cardiac murmur, unspecified: Secondary | ICD-10-CM

## 2021-11-10 DIAGNOSIS — K219 Gastro-esophageal reflux disease without esophagitis: Secondary | ICD-10-CM

## 2021-11-10 NOTE — Progress Notes (Signed)
BP 134/86   Pulse 90   Temp (!) 96.1 F (35.6 C)   Wt 214 lb (97.1 kg)   SpO2 97%   BMI 33.52 kg/m    Subjective:    Patient ID: Susan Velasquez, female    DOB: [birth year], 57 y.o.   MRN: [id number]  HPI: Susan Velasquez is a 56 y.o. female presenting on 11/10/2021 for Hypertension, Hyperlipidemia, and Gynecologic Exam   HPI  Chief Complaint  Patient presents with   Hypertension   Hyperlipidemia   Gynecologic Exam    Pt says She is not watching what she eats.   She sometimes has GI symptoms.   Otherwise she has no complaints    LMP > 2 year    Relevant past medical, surgical, family and social history reviewed and updated as indicated. Interim medical history since our last visit reviewed. Allergies and medications reviewed and updated.   Current Outpatient Medications:    amLODipine (NORVASC) 10 MG tablet, Take 1 tablet (10 mg total) by mouth daily., Disp: 90 tablet, Rfl: 0   atorvastatin (LIPITOR) 20 MG tablet, Take 1 tablet (20 mg total) by mouth daily., Disp: 90 tablet, Rfl: 0   omeprazole (PRILOSEC) 40 MG capsule, Take 1 capsule (40 mg total) by mouth daily., Disp: 90 capsule, Rfl: 0    Review of Systems  Per HPI unless specifically indicated above     Objective:    BP 134/86   Pulse 90   Temp (!) 96.1 F (35.6 C)   Wt 214 lb (97.1 kg)   SpO2 97%   BMI 33.52 kg/m   Wt Readings from Last 3 Encounters:  11/10/21 214 lb (97.1 kg)  08/11/21 212 lb (96.2 kg)  02/12/21 215 lb (97.5 kg)    Physical Exam Vitals and nursing note reviewed. Exam conducted with a chaperone present.  Constitutional:      General: She is not in acute distress.    Appearance: She is well-developed. She is obese. She is not toxic-appearing.  HENT:     Head: Normocephalic and atraumatic.  Cardiovascular:     Rate and Rhythm: Normal rate and regular rhythm.     Heart sounds: Murmur heard.  Pulmonary:     Effort: Pulmonary effort is normal.     Breath sounds: Normal breath  sounds.  Chest:  Breasts:    Right: Normal.     Left: Normal.  Abdominal:     General: Bowel sounds are normal.     Palpations: Abdomen is soft. There is no mass.     Tenderness: There is no abdominal tenderness. There is no guarding or rebound.  Genitourinary:    Labia:        Right: No rash, tenderness or lesion.        Left: No rash, tenderness or lesion.      Vagina: Normal.     Cervix: No cervical motion tenderness, discharge or friability.     Adnexa:        Right: No mass, tenderness or fullness.         Left: No mass, tenderness or fullness.       Comments: (Nurse Berenice assisted) Musculoskeletal:     Cervical back: Neck supple.     Right lower leg: No edema.     Left lower leg: No edema.  Lymphadenopathy:     Cervical: No cervical adenopathy.  Skin:    General: Skin is warm and dry.  Neurological:  Mental Status: She is alert and oriented to person, place, and time.     Gait: Gait is intact.  Psychiatric:        Behavior: Behavior normal.       Murmur   Results for orders placed or performed during the hospital encounter of 11/09/21  Lipid panel  Result Value Ref Range   Cholesterol 179 0 - 200 mg/dL   Triglycerides 962 (H) <150 mg/dL   HDL 37 (L) >83 mg/dL   Total CHOL/HDL Ratio 4.8 RATIO   VLDL 30 0 - 40 mg/dL   LDL Cholesterol 662 (H) 0 - 99 mg/dL  Comprehensive metabolic panel  Result Value Ref Range   Sodium 139 135 - 145 mmol/L   Potassium 4.3 3.5 - 5.1 mmol/L   Chloride 106 98 - 111 mmol/L   CO2 27 22 - 32 mmol/L   Glucose, Bld 108 (H) 70 - 99 mg/dL   BUN 9 6 - 20 mg/dL   Creatinine, Ser 9.47 0.44 - 1.00 mg/dL   Calcium 9.6 8.9 - 65.4 mg/dL   Total Protein 7.8 6.5 - 8.1 g/dL   Albumin 4.0 3.5 - 5.0 g/dL   AST 15 15 - 41 U/L   ALT 16 0 - 44 U/L   Alkaline Phosphatase 121 38 - 126 U/L   Total Bilirubin 0.6 0.3 - 1.2 mg/dL   GFR, Estimated >65 >03 mL/min   Anion gap 6 5 - 15      Assessment & Plan:    Encounter Diagnoses  Name  Primary?   Primary hypertension Yes   Hyperlipidemia, unspecified hyperlipidemia type    Gastroesophageal reflux disease, unspecified whether esophagitis present    Routine cervical smear    Murmur, cardiac    Tobacco use disorder    Obesity, unspecified classification, unspecified obesity type, unspecified whether serious comorbidity present       -discussed with pt that she can take omeprazole bid but needs to watch diet and makes some lifestyle changes in order to help her GERD (like not eating right before bed) -will check Echo to evaluate murmur -pt is given application for [employer] financial assistance/[facility] -encouraged smoking cessation -pt to follow up 3 months.  She is to contact office sooner prn worsening or new symptoms

## 2021-11-15 ENCOUNTER — Other Ambulatory Visit: Payer: Self-pay | Admitting: Physician Assistant

## 2021-11-15 LAB — CYTOLOGY - PAP
Comment: NEGATIVE
Diagnosis: NEGATIVE
High risk HPV: NEGATIVE

## 2021-11-16 ENCOUNTER — Ambulatory Visit (HOSPITAL_COMMUNITY)
Admission: RE | Admit: 2021-11-16 | Discharge: 2021-11-16 | Disposition: A | Discharge status: Home / Self Care | Payer: Self-pay | Source: Ambulatory Visit | Attending: Physician Assistant | Admitting: Physician Assistant

## 2021-11-16 DIAGNOSIS — R011 Cardiac murmur, unspecified: Secondary | ICD-10-CM | POA: Insufficient documentation

## 2021-11-16 LAB — ECHOCARDIOGRAM COMPLETE
Area-P 1/2: 3.37 cm2
S' Lateral: 2.5 cm

## 2021-11-16 NOTE — Progress Notes (Signed)
*  PRELIMINARY RESULTS* Echocardiogram 2D Echocardiogram has been performed.  Stacey Drain 11/16/2021, 12:33 PM

## 2021-12-20 ENCOUNTER — Other Ambulatory Visit: Payer: Self-pay | Admitting: Physician Assistant

## 2021-12-20 MED ORDER — ATORVASTATIN CALCIUM 20 MG PO TABS: ORAL_TABLET | ORAL | 0 refills | Status: DC

## 2021-12-20 MED ORDER — OMEPRAZOLE 40 MG PO CPDR: DELAYED_RELEASE_CAPSULE | ORAL | 0 refills | Status: DC

## 2021-12-20 MED ORDER — AMLODIPINE BESYLATE 10 MG PO TABS: ORAL_TABLET | ORAL | 0 refills | Status: DC

## 2022-01-25 ENCOUNTER — Other Ambulatory Visit: Payer: Self-pay | Admitting: Physician Assistant

## 2022-01-25 DIAGNOSIS — I10 Essential (primary) hypertension: Secondary | ICD-10-CM

## 2022-01-25 DIAGNOSIS — E785 Hyperlipidemia, unspecified: Secondary | ICD-10-CM

## 2022-02-08 ENCOUNTER — Encounter: Payer: Self-pay | Admitting: Physician Assistant

## 2022-02-08 ENCOUNTER — Ambulatory Visit: Payer: Self-pay | Admitting: Physician Assistant

## 2022-02-08 VITALS — BP 135/93 | HR 91 | Temp 97.9°F

## 2022-02-08 DIAGNOSIS — E785 Hyperlipidemia, unspecified: Secondary | ICD-10-CM

## 2022-02-08 DIAGNOSIS — Z1239 Encounter for other screening for malignant neoplasm of breast: Secondary | ICD-10-CM

## 2022-02-08 DIAGNOSIS — J209 Acute bronchitis, unspecified: Secondary | ICD-10-CM

## 2022-02-08 DIAGNOSIS — F17200 Nicotine dependence, unspecified, uncomplicated: Secondary | ICD-10-CM

## 2022-02-08 DIAGNOSIS — K219 Gastro-esophageal reflux disease without esophagitis: Secondary | ICD-10-CM

## 2022-02-08 DIAGNOSIS — I10 Essential (primary) hypertension: Secondary | ICD-10-CM

## 2022-02-08 MED ORDER — AZITHROMYCIN 250 MG PO TABS: ORAL_TABLET | ORAL | 0 refills | Status: AC

## 2022-02-08 MED ORDER — PREDNISONE 10 MG PO TABS: 10.0000 mg | ORAL_TABLET | Freq: Two times a day (BID) | ORAL | 0 refills | Status: DC

## 2022-02-08 NOTE — Progress Notes (Signed)
BP (!) 135/93   Pulse 91   Temp 97.9 F (36.6 C)   SpO2 97%    Subjective:    Patient ID: Susan Velasquez, female    DOB: 08-16-1965, 56 y.o.   MRN: [id number]  HPI: Susan Velasquez is a 56 y.o. female presenting on 02/08/2022 for Hypertension, Hyperlipidemia, and Gastroesophageal Reflux   HPI  Chief Complaint  Patient presents with   Hypertension   Hyperlipidemia   Gastroesophageal Reflux     She says her Stomach/gerd does okay so long as she doesn't eat "horrible"  She has been Coughing for about 3 weeks.  Ears stopped up.    Covid test 4 days ago was negative.  She doesn't feel horrible but is just tired.   No fever.  Coughs up phlegm.  Sometimes wheezes but no sob.   She has been taking some OTC cold meds       Relevant past medical, surgical, family and social history reviewed and updated as indicated. Interim medical history since our last visit reviewed. Allergies and medications reviewed and updated.   Current Outpatient Medications:    amLODipine (NORVASC) 10 MG tablet, TAKE 1 Tablet BY MOUTH ONCE EVERY DAY, Disp: 30 tablet, Rfl: 0   atorvastatin (LIPITOR) 20 MG tablet, TAKE 1 Tablet BY MOUTH ONCE EVERY DAY, Disp: 30 tablet, Rfl: 0   omeprazole (PRILOSEC) 40 MG capsule, TAKE 1 Capsule BY MOUTH ONCE EVERY DAY, Disp: 30 capsule, Rfl: 0   Review of Systems  Per HPI unless specifically indicated above     Objective:    BP (!) 135/93   Pulse 91   Temp 97.9 F (36.6 C)   SpO2 97%   Wt Readings from Last 3 Encounters:  11/10/21 214 lb (97.1 kg)  08/11/21 212 lb (96.2 kg)  02/12/21 215 lb (97.5 kg)    Physical Exam Vitals reviewed.  Constitutional:      General: She is not in acute distress.    Appearance: She is well-developed. She is not toxic-appearing.  HENT:     Head: Normocephalic and atraumatic.  Cardiovascular:     Rate and Rhythm: Normal rate and regular rhythm.  Pulmonary:     Effort: Pulmonary effort is normal. No respiratory distress.      Breath sounds: Wheezing present. No rhonchi or rales.     Comments: Occasional scattered expiratory wheeze Abdominal:     General: Bowel sounds are normal.     Palpations: Abdomen is soft. There is no mass.     Tenderness: There is no abdominal tenderness. There is no guarding or rebound.  Musculoskeletal:     Cervical back: Neck supple.     Right lower leg: No edema.     Left lower leg: No edema.  Lymphadenopathy:     Cervical: No cervical adenopathy.  Skin:    General: Skin is warm and dry.  Neurological:     Mental Status: She is alert and oriented to person, place, and time.  Psychiatric:        Behavior: Behavior normal.           Assessment & Plan:   Encounter Diagnoses  Name Primary?   Primary hypertension Yes   Hyperlipidemia, unspecified hyperlipidemia type    Gastroesophageal reflux disease, unspecified whether esophagitis present    Tobacco use disorder    Acute bronchitis, unspecified organism    Encounter for screening for malignant neoplasm of breast, unspecified screening modality      -  pt to continue current medications -bp a bit elevated today but she is taking decongestants and is sick -she is to get her fasting labs drawn.  She will be called with results -refer for screening Mammogram -rx prednisone and zpack for bronchitis.  Encouraged to avoid smoking.    -pt to follow up 3 months.  She is to contact office sooner prn

## 2022-02-11 ENCOUNTER — Telehealth: Payer: Self-pay

## 2022-02-11 NOTE — Telephone Encounter (Signed)
Telephoned patient at mobile number. Left a voice message with [facility] (scholarship) contact information. 

## 2022-03-09 NOTE — Telephone Encounter (Signed)
Telephoned patient at mobile number. Left a voice message with [facility] contact information. 

## 2022-04-18 ENCOUNTER — Other Ambulatory Visit: Payer: Self-pay | Admitting: Physician Assistant

## 2022-04-27 NOTE — Congregational Nurse Program (Signed)
Received pt Care Connect Packet on today by Elliot Gurney, Care Guide of [facility] Uninsured Program  Pt has completed RENEWAL enrollment into the [employer] [facility] Uninsured Program on 2.19.24 and is approved until 2.19.25  Reviewed pt health questionnaire and PH-Q (3) status  Socio-determinants health needs identified: -No current needs at this time as it relates to sociodeterminant health needs  Pt continues to stay connected with her PCP at [facility] within the last 6 months (last appt of 12.5.23) and with a next upcoming appointment coming up  on March.5, 2024.  Pt does not voice any current medical concerns outside of what she has previously discussed with her PCP nor any related to receipt of medications.       Plan - [employer] Application was submitted and approved until 10.1.2024 per [employer] application  on hold to be forwarded until [facility]  decision letter received per Care Connect  -Nurse case management will be available for patient as needed.

## 2022-05-02 ENCOUNTER — Other Ambulatory Visit: Payer: Self-pay | Admitting: Physician Assistant

## 2022-05-02 MED ORDER — ATORVASTATIN CALCIUM 20 MG PO TABS: ORAL_TABLET | ORAL | 0 refills | Status: DC

## 2022-05-02 MED ORDER — AMLODIPINE BESYLATE 10 MG PO TABS: ORAL_TABLET | ORAL | 0 refills | Status: DC

## 2022-05-06 ENCOUNTER — Other Ambulatory Visit (HOSPITAL_COMMUNITY)
Admission: RE | Admit: 2022-05-06 | Discharge: 2022-05-06 | Disposition: A | Discharge status: Home / Self Care | Payer: Self-pay | Source: Ambulatory Visit | Attending: Physician Assistant | Admitting: Physician Assistant

## 2022-05-06 DIAGNOSIS — I10 Essential (primary) hypertension: Secondary | ICD-10-CM | POA: Insufficient documentation

## 2022-05-06 DIAGNOSIS — E785 Hyperlipidemia, unspecified: Secondary | ICD-10-CM | POA: Insufficient documentation

## 2022-05-06 LAB — LIPID PANEL
Cholesterol: 180 mg/dL (ref 0–200)
HDL: 37 mg/dL — ABNORMAL LOW (ref >40)
LDL Cholesterol: 112 mg/dL — ABNORMAL HIGH (ref 0–99)
Total CHOL/HDL Ratio: 4.9 RATIO
Triglycerides: 156 mg/dL — ABNORMAL HIGH (ref <150)
VLDL: 31 mg/dL (ref 0–40)

## 2022-05-06 LAB — COMPREHENSIVE METABOLIC PANEL
ALT: 19 U/L (ref 0–44)
AST: 15 U/L (ref 15–41)
Albumin: 3.9 g/dL (ref 3.5–5.0)
Alkaline Phosphatase: 100 U/L (ref 38–126)
Anion gap: 3 — ABNORMAL LOW (ref 5–15)
BUN: 9 mg/dL (ref 6–20)
CO2: 27 mmol/L (ref 22–32)
Calcium: 9 mg/dL (ref 8.9–10.3)
Chloride: 106 mmol/L (ref 98–111)
Creatinine, Ser: 1.11 mg/dL — ABNORMAL HIGH (ref 0.44–1.00)
GFR, Estimated: 59 mL/min — ABNORMAL LOW (ref >60)
Glucose, Bld: 125 mg/dL — ABNORMAL HIGH (ref 70–99)
Potassium: 4.2 mmol/L (ref 3.5–5.1)
Sodium: 136 mmol/L (ref 135–145)
Total Bilirubin: 0.3 mg/dL (ref 0.3–1.2)
Total Protein: 7.5 g/dL (ref 6.5–8.1)

## 2022-05-10 ENCOUNTER — Ambulatory Visit: Payer: Self-pay | Admitting: Physician Assistant

## 2022-05-10 ENCOUNTER — Other Ambulatory Visit: Payer: Self-pay | Admitting: Physician Assistant

## 2022-05-10 ENCOUNTER — Encounter: Payer: Self-pay | Admitting: Physician Assistant

## 2022-05-10 VITALS — BP 137/92 | HR 95 | Temp 97.9°F | Wt 225.8 lb

## 2022-05-10 DIAGNOSIS — E785 Hyperlipidemia, unspecified: Secondary | ICD-10-CM

## 2022-05-10 DIAGNOSIS — R7989 Other specified abnormal findings of blood chemistry: Secondary | ICD-10-CM

## 2022-05-10 DIAGNOSIS — K219 Gastro-esophageal reflux disease without esophagitis: Secondary | ICD-10-CM

## 2022-05-10 DIAGNOSIS — Z1211 Encounter for screening for malignant neoplasm of colon: Secondary | ICD-10-CM

## 2022-05-10 DIAGNOSIS — I10 Essential (primary) hypertension: Secondary | ICD-10-CM

## 2022-05-10 DIAGNOSIS — F17200 Nicotine dependence, unspecified, uncomplicated: Secondary | ICD-10-CM

## 2022-05-10 MED ORDER — AMLODIPINE BESYLATE 10 MG PO TABS: ORAL_TABLET | ORAL | 1 refills | Status: AC

## 2022-05-10 MED ORDER — LOSARTAN POTASSIUM 50 MG PO TABS: 50.0000 mg | ORAL_TABLET | Freq: Every day | ORAL | 1 refills | Status: AC

## 2022-05-10 MED ORDER — OMEPRAZOLE 40 MG PO CPDR: DELAYED_RELEASE_CAPSULE | ORAL | 1 refills | Status: AC

## 2022-05-10 MED ORDER — ATORVASTATIN CALCIUM 20 MG PO TABS: ORAL_TABLET | ORAL | 1 refills | Status: AC

## 2022-05-10 NOTE — Patient Instructions (Signed)
Contact [facility] to schedule your mammogram - [phone]

## 2022-05-10 NOTE — Progress Notes (Signed)
BP (!) 137/92   Pulse 95   Temp 97.9 F (36.6 C)   Wt 225 lb 12 oz (102.4 kg)   SpO2 96%   BMI 35.36 kg/m    Subjective:    Patient ID: Susan Velasquez, female    DOB: 1965-04-02, 57 y.o.   MRN: [id number]  HPI: ANUHYA SURGEON is a 57 y.o. female presenting on 05/10/2022 for Hypertension and Hyperlipidemia   HPI  Chief Complaint  Patient presents with   Hypertension   Hyperlipidemia    Pt says she is doing better.  She is getting out and socializing.  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  She has been using OTC omeprazole lately which doesn't work as well.     Relevant past medical, surgical, family and social history reviewed and updated as indicated. Interim medical history since our last visit reviewed. Allergies and medications reviewed and updated.   Current Outpatient Medications:    amLODipine (NORVASC) 10 MG tablet, TAKE 1 Tablet BY MOUTH ONCE EVERY DAY, Disp: 30 tablet, Rfl: 0   atorvastatin (LIPITOR) 20 MG tablet, TAKE 1 Tablet BY MOUTH ONCE EVERY DAY, Disp: 30 tablet, Rfl: 0   omeprazole (PRILOSEC) 40 MG capsule, TAKE 1 Capsule BY MOUTH ONCE EVERY DAY, Disp: 30 capsule, Rfl: 0    Review of Systems  Per HPI unless specifically indicated above     Objective:    BP (!) 137/92   Pulse 95   Temp 97.9 F (36.6 C)   Wt 225 lb 12 oz (102.4 kg)   SpO2 96%   BMI 35.36 kg/m   Wt Readings from Last 3 Encounters:  05/10/22 225 lb 12 oz (102.4 kg)  11/10/21 214 lb (97.1 kg)  08/11/21 212 lb (96.2 kg)    Physical Exam Vitals reviewed.  Constitutional:      General: She is not in acute distress.    Appearance: She is well-developed. She is not toxic-appearing.  HENT:     Head: Normocephalic and atraumatic.  Cardiovascular:     Rate and Rhythm: Normal rate and regular rhythm.  Pulmonary:     Effort: Pulmonary effort is normal.     Breath sounds: Normal breath sounds.  Abdominal:      General: Bowel sounds are normal.     Palpations: Abdomen is soft. There is no mass.     Tenderness: There is no abdominal tenderness. There is no guarding or rebound.  Musculoskeletal:     Cervical back: Neck supple.     Right lower leg: No edema.     Left lower leg: No edema.  Lymphadenopathy:     Cervical: No cervical adenopathy.  Skin:    General: Skin is warm and dry.  Neurological:     Mental Status: She is alert and oriented to person, place, and time.  Psychiatric:        Attention and Perception: Attention normal.        Speech: Speech normal.        Behavior: Behavior normal. Behavior is cooperative.     Results for orders placed or performed during the hospital encounter of 05/06/22  Comprehensive metabolic panel  Result Value Ref Range   Sodium 136 135 - 145 mmol/L   Potassium 4.2 3.5 - 5.1 mmol/L   Chloride 106 98 - 111 mmol/L   CO2 27 22 - 32 mmol/L   Glucose, Bld  125 (H) 70 - 99 mg/dL   BUN 9 6 - 20 mg/dL   Creatinine, Ser 1.11 (H) 0.44 - 1.00 mg/dL   Calcium 9.0 8.9 - 10.3 mg/dL   Total Protein 7.5 6.5 - 8.1 g/dL   Albumin 3.9 3.5 - 5.0 g/dL   AST 15 15 - 41 U/L   ALT 19 0 - 44 U/L   Alkaline Phosphatase 100 38 - 126 U/L   Total Bilirubin 0.3 0.3 - 1.2 mg/dL   GFR, Estimated 59 (L) >60 mL/min   Anion gap 3 (L) 5 - 15  Lipid panel  Result Value Ref Range   Cholesterol 180 0 - 200 mg/dL   Triglycerides 156 (H) <150 mg/dL   HDL 37 (L) >40 mg/dL   Total CHOL/HDL Ratio 4.9 RATIO   VLDL 31 0 - 40 mg/dL   LDL Cholesterol 112 (H) 0 - 99 mg/dL      Assessment & Plan:    Encounter Diagnoses  Name Primary?   Primary hypertension Yes   Hyperlipidemia, unspecified hyperlipidemia type    Gastroesophageal reflux disease, unspecified whether esophagitis present    Elevated serum creatinine    Tobacco use disorder    Screening for colon cancer      -reviewed labs with pt -pt to continue current meds.  Will Add losartan -pt is given FIT test for colon  cancer screening -Pt to call to schedule her mammogram.  She is given phone number for [facility] -pt is encouraged to drink more water to help with renal function -discussed with pt that [facility] is available for counseling if she is interested -pt to follow up 6 weeks to recheck BP.  She is to contact office sooner prn

## 2022-06-21 ENCOUNTER — Encounter: Payer: Self-pay | Admitting: Physician Assistant

## 2022-06-21 ENCOUNTER — Ambulatory Visit: Payer: Self-pay | Admitting: Physician Assistant

## 2022-06-21 VITALS — BP 135/82 | HR 93 | Temp 97.5°F | Wt 229.0 lb

## 2022-06-21 DIAGNOSIS — F17200 Nicotine dependence, unspecified, uncomplicated: Secondary | ICD-10-CM

## 2022-06-21 DIAGNOSIS — E785 Hyperlipidemia, unspecified: Secondary | ICD-10-CM

## 2022-06-21 DIAGNOSIS — I10 Essential (primary) hypertension: Secondary | ICD-10-CM

## 2022-06-21 DIAGNOSIS — K219 Gastro-esophageal reflux disease without esophagitis: Secondary | ICD-10-CM

## 2022-06-21 NOTE — Progress Notes (Signed)
   BP 135/82   Pulse 93   Temp (!) 97.5 F (36.4 C)   Wt 229 lb (103.9 kg)   SpO2 98%   BMI 35.87 kg/m    Subjective:    Patient ID: Susan Velasquez, female    DOB: 27-Mar-1965, 57 y.o.   MRN: [id number]  HPI: Susan Velasquez is a 57 y.o. female presenting on 06/21/2022 for Hypertension   HPI   Chief Complaint  Patient presents with   Hypertension     Pt checks bp at home.  Some days in 130s, some days 120s.   She has medicaid now so knows that she is no longer eligible for services at [facility]    Relevant past medical, surgical, family and social history reviewed and updated as indicated. Interim medical history since our last visit reviewed. Allergies and medications reviewed and updated.   Current Outpatient Medications:    amLODipine (NORVASC) 10 MG tablet, TAKE 1 Tablet BY MOUTH ONCE EVERY DAY, Disp: 90 tablet, Rfl: 1   atorvastatin (LIPITOR) 20 MG tablet, TAKE 1 Tablet BY MOUTH ONCE EVERY DAY, Disp: 90 tablet, Rfl: 1   losartan (COZAAR) 50 MG tablet, Take 1 tablet (50 mg total) by mouth daily., Disp: 90 tablet, Rfl: 1   omeprazole (PRILOSEC) 40 MG capsule, TAKE 1 Capsule BY MOUTH ONCE EVERY DAY, Disp: 90 capsule, Rfl: 1    Review of Systems  Per HPI unless specifically indicated above     Objective:    BP 135/82   Pulse 93   Temp (!) 97.5 F (36.4 C)   Wt 229 lb (103.9 kg)   SpO2 98%   BMI 35.87 kg/m   Wt Readings from Last 3 Encounters:  06/21/22 229 lb (103.9 kg)  05/10/22 225 lb 12 oz (102.4 kg)  11/10/21 214 lb (97.1 kg)    Physical Exam Constitutional:      General: She is not in acute distress.    Appearance: She is not toxic-appearing.  HENT:     Head: Normocephalic and atraumatic.  Pulmonary:     Effort: Pulmonary effort is normal. No respiratory distress.  Neurological:     Mental Status: She is alert and oriented to person, place, and time.  Psychiatric:        Behavior: Behavior normal.            Assessment & Plan:    Encounter Diagnoses  Name Primary?   Primary hypertension Yes   Hyperlipidemia, unspecified hyperlipidemia type    Gastroesophageal reflux disease, unspecified whether esophagitis present    Tobacco use disorder       Pt to continue current medications Pt to get established with new PCP

## 2023-08-03 IMAGING — MG MM DIGITAL SCREENING BILAT W/ TOMO AND CAD
8 series · 8 of 24 positions shown · non-contrast
Comparison: None.

ACR Breast Density Category a: The breast tissue is almost entirely
fatty.

CLINICAL DATA: Screening.

EXAM:
DIGITAL SCREENING BILATERAL MAMMOGRAM WITH TOMOSYNTHESIS AND CAD
TECHNIQUE: Bilateral screening digital craniocaudal and mediolateral oblique
mammograms were obtained. Bilateral screening digital breast
tomosynthesis was performed. The images were evaluated with
computer-aided detection.

[R CC synth-2D]
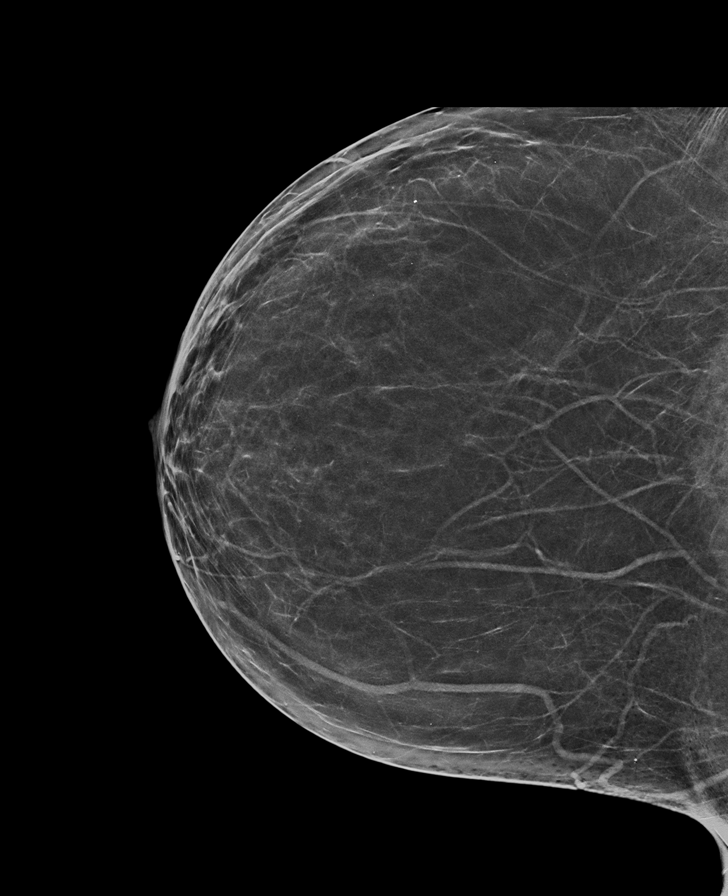

[L MLO synth-2D]
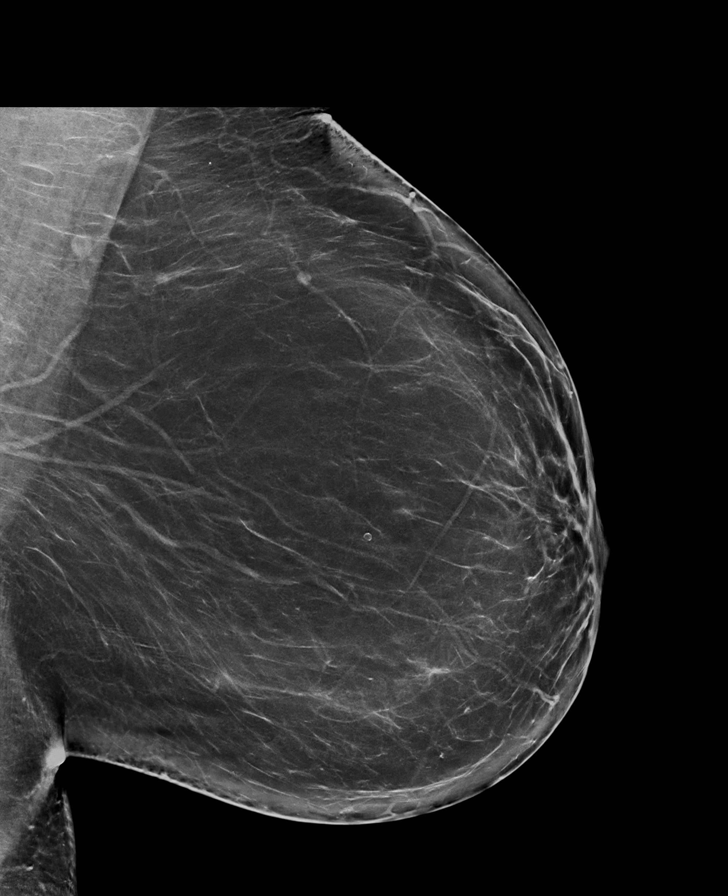

[L CC synth-2D]
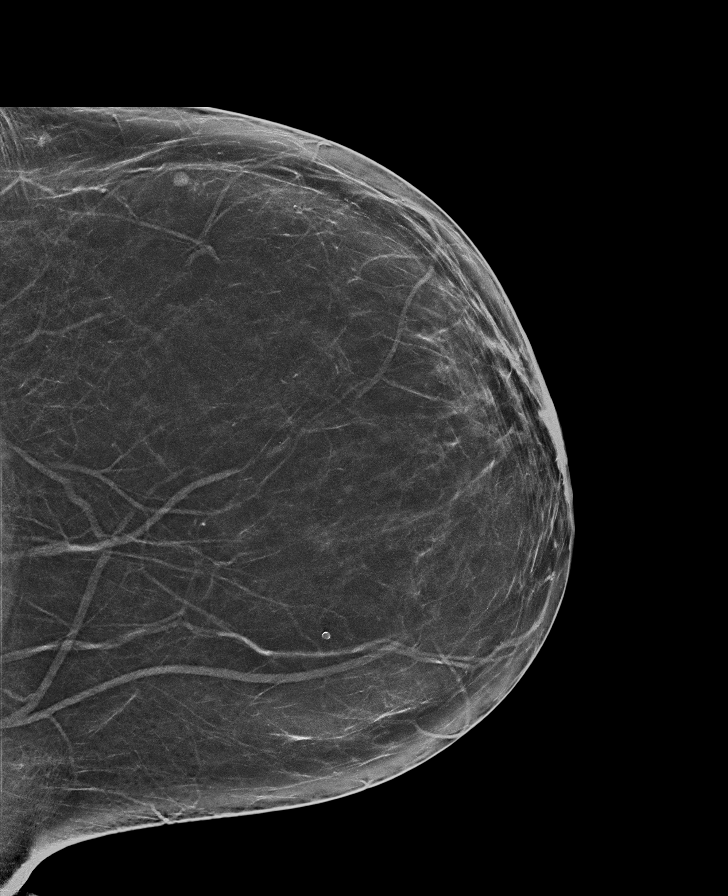

[R MLO synth-2D]
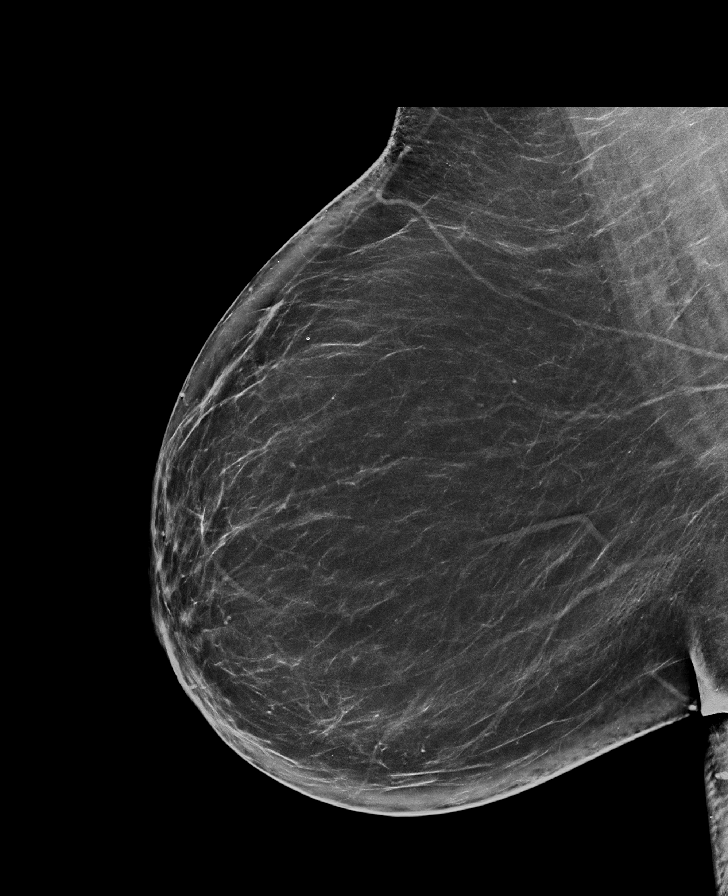

[L CC tomo · tomo slice 39/76.0]
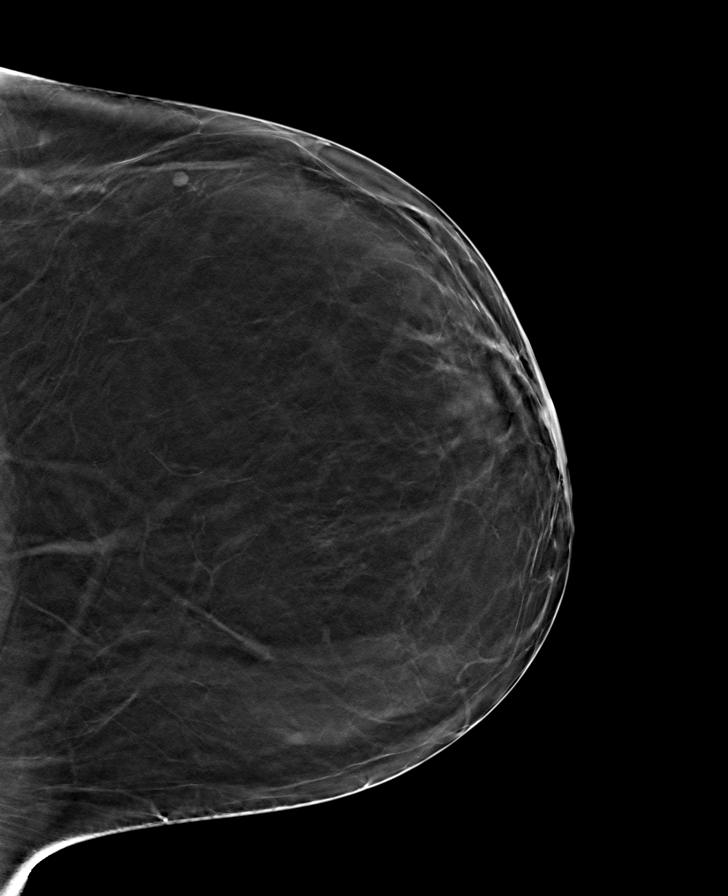

[L MLO tomo · tomo slice 45/88.0]
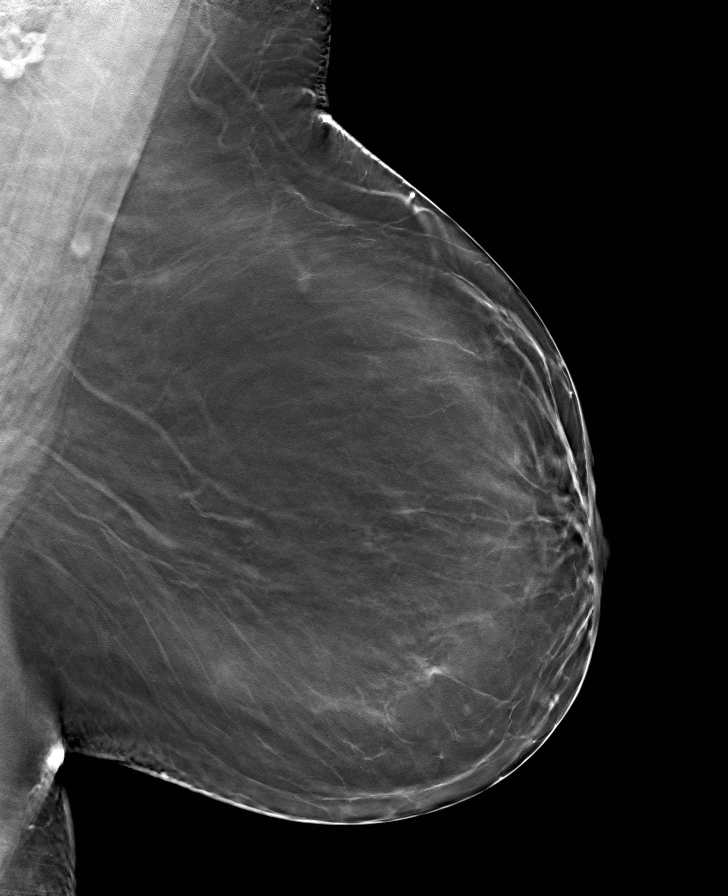

[R CC tomo · tomo slice 36/71.0]
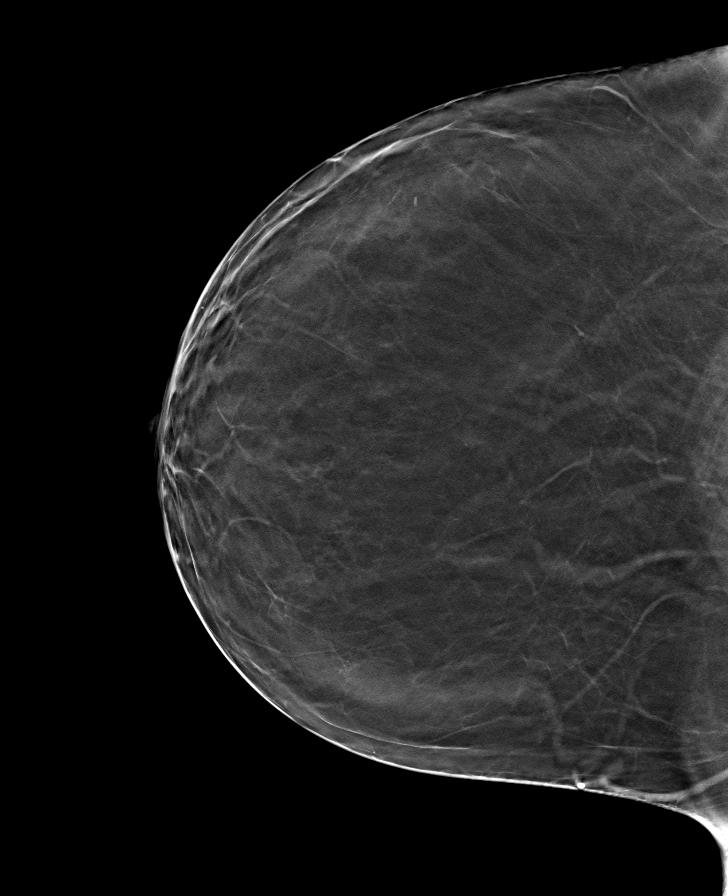

[R MLO tomo · tomo slice 43/84.0]
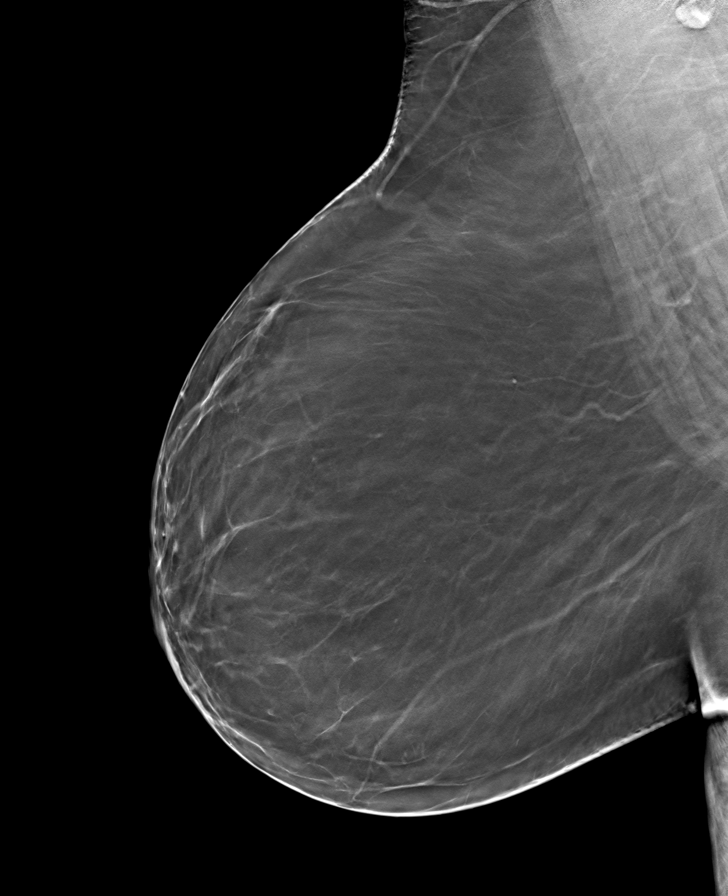

[8 of 24 positions shown; findings below may reference images not displayed]

FINDINGS: There are no findings suspicious for malignancy.
IMPRESSION: No mammographic evidence of malignancy. A result letter of this
screening mammogram will be mailed directly to the patient.

RECOMMENDATION:
Screening mammogram in one year. (Code:44-M-M6Q)

BI-RADS CATEGORY  1: Negative.

## 2023-09-04 IMAGING — CR DG LUMBAR SPINE 2-3V
2 series · 2 of 2 positions shown · non-contrast
Comparison: Lumbar spine MRI 11/05/2020.

CLINICAL DATA: Microdiscectomy L5-S1.

EXAM:
LUMBAR SPINE - 2-3 VIEW

[lateral (1 of 2)]
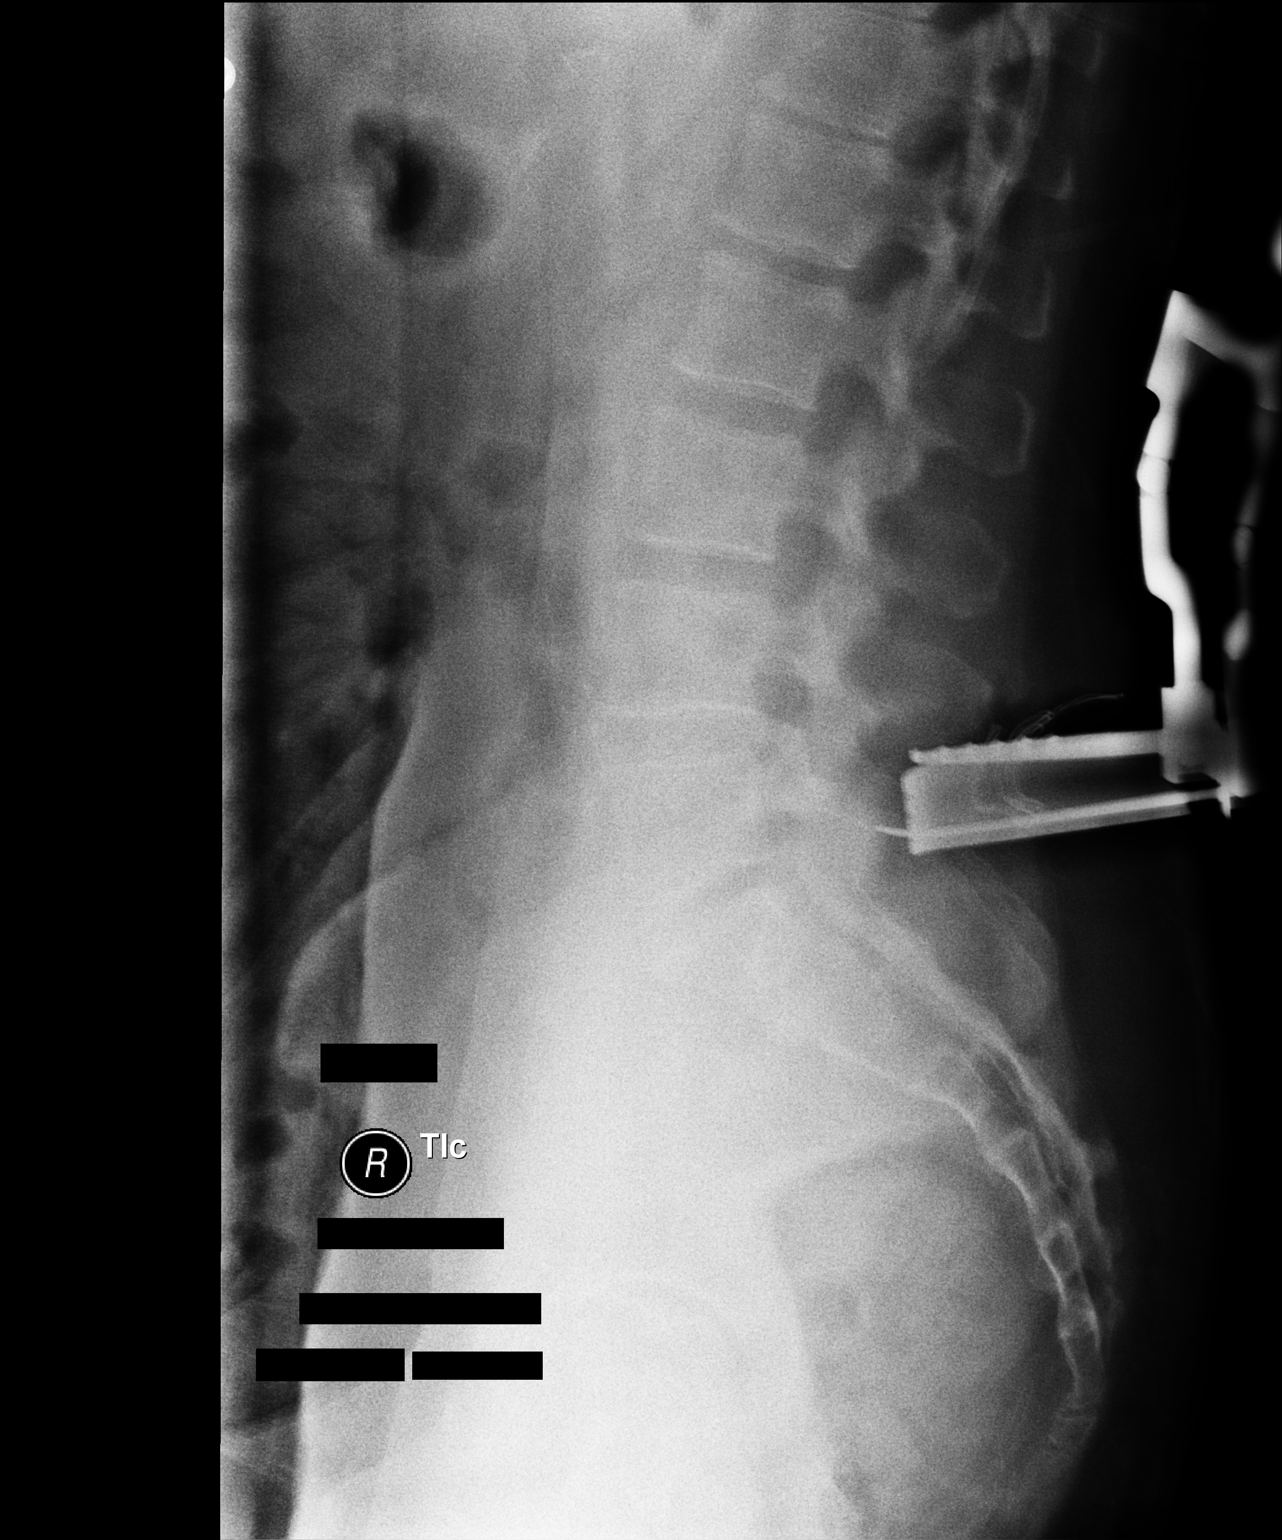

[lateral (2 of 2)]
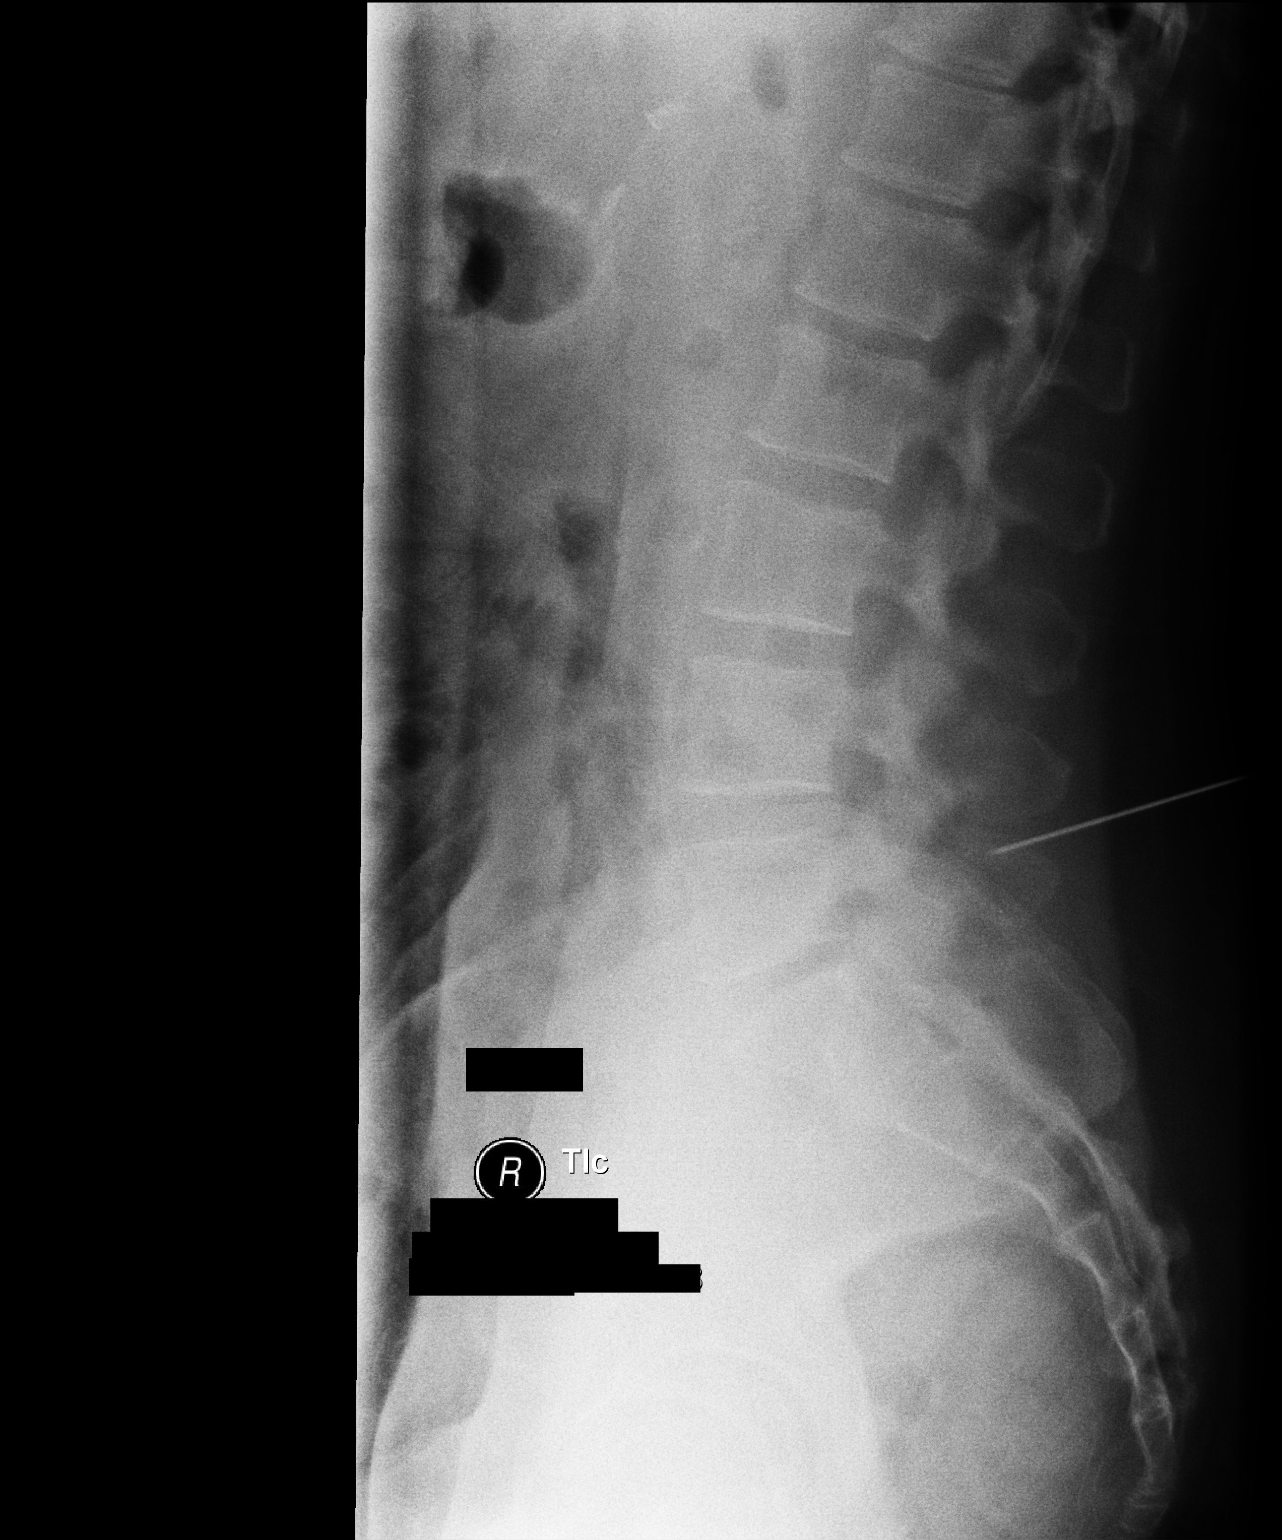

[2 of 2 positions shown; findings below may reference images not displayed]

FINDINGS: Intraoperative lumbar spine.

Two low resolution intraoperative spot views of the lumbar spine
were obtained. Instrument seen overlying the posterior elements at
L5-S1. No fracture visible on the limited views.
IMPRESSION: As above.

## 2023-10-11 ENCOUNTER — Encounter: Payer: Self-pay | Admitting: Emergency Medicine

## 2023-10-11 ENCOUNTER — Ambulatory Visit
Admission: EM | Admit: 2023-10-11 | Discharge: 2023-10-11 | Disposition: A | Discharge status: Home / Self Care | Attending: Nurse Practitioner | Admitting: Nurse Practitioner

## 2023-10-11 ENCOUNTER — Other Ambulatory Visit: Payer: Self-pay

## 2023-10-11 DIAGNOSIS — K0889 Other specified disorders of teeth and supporting structures: Secondary | ICD-10-CM

## 2023-10-11 DIAGNOSIS — K047 Periapical abscess without sinus: Secondary | ICD-10-CM | POA: Diagnosis not present

## 2023-10-11 HISTORY — DX: Pure hypercholesterolemia, unspecified: E78.00

## 2023-10-11 MED ORDER — AMOXICILLIN-POT CLAVULANATE 875-125 MG PO TABS: 1.0000 | ORAL_TABLET | Freq: Two times a day (BID) | ORAL | 0 refills | Status: AC

## 2023-10-11 NOTE — Discharge Instructions (Signed)
 Take medication as prescribed. You may continue over-the-counter Tylenol  as needed for pain, fever, or general discomfort. Recommend warm salt water gargles as needed for pain or discomfort. Recommend a soft diet while symptoms persist. You may use warm compresses to help with pain or discomfort, cool compresses to help with pain or swelling. I have provided a list of dental resources for you to refer to. Go to the emergency department immediately if you experience worsening dental pain, facial swelling, or other concerns. Follow-up as needed.

## 2023-10-11 NOTE — ED Triage Notes (Signed)
 Pt reports right sided dental pain and facial swelling for last several days. Reports tooth is chipped and I want ot make sure it isn't abscessed. Is currently trying to get in to dentist.
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
## 2023-12-19 ENCOUNTER — Other Ambulatory Visit (HOSPITAL_COMMUNITY): Payer: Self-pay | Admitting: Physician Assistant

## 2023-12-19 DIAGNOSIS — Z1231 Encounter for screening mammogram for malignant neoplasm of breast: Secondary | ICD-10-CM

## 2023-12-28 ENCOUNTER — Encounter (HOSPITAL_COMMUNITY): Payer: Self-pay

## 2023-12-28 ENCOUNTER — Ambulatory Visit (HOSPITAL_COMMUNITY)
Admission: RE | Admit: 2023-12-28 | Discharge: 2023-12-28 | Disposition: A | Discharge status: Home / Self Care | Source: Ambulatory Visit | Attending: Physician Assistant | Admitting: Physician Assistant

## 2023-12-28 DIAGNOSIS — Z1231 Encounter for screening mammogram for malignant neoplasm of breast: Secondary | ICD-10-CM | POA: Diagnosis present

## 2024-01-08 ENCOUNTER — Ambulatory Visit (HOSPITAL_COMMUNITY)
Admission: RE | Admit: 2024-01-08 | Discharge: 2024-01-08 | Disposition: A | Discharge status: Home / Self Care | Source: Ambulatory Visit | Attending: Physician Assistant | Admitting: Physician Assistant

## 2024-01-08 ENCOUNTER — Other Ambulatory Visit (HOSPITAL_COMMUNITY): Payer: Self-pay | Admitting: Physician Assistant

## 2024-01-08 ENCOUNTER — Encounter (HOSPITAL_COMMUNITY): Payer: Self-pay | Admitting: Physician Assistant

## 2024-01-08 DIAGNOSIS — M65311 Trigger thumb, right thumb: Secondary | ICD-10-CM | POA: Insufficient documentation
